# Patient Record
Sex: Female | Born: 1980 | Race: Black or African American | Hispanic: No | Marital: Married | Smoking: Never smoker
Health system: Southern US, Community
[De-identification: ages and names within clinical notes are randomized; demographics above are authoritative.]

## PROBLEM LIST (undated history)

## (undated) DIAGNOSIS — E663 Overweight: Secondary | ICD-10-CM

## (undated) DIAGNOSIS — R87629 Unspecified abnormal cytological findings in specimens from vagina: Secondary | ICD-10-CM

## (undated) DIAGNOSIS — K760 Fatty (change of) liver, not elsewhere classified: Secondary | ICD-10-CM

## (undated) DIAGNOSIS — B351 Tinea unguium: Secondary | ICD-10-CM

## (undated) DIAGNOSIS — Z8759 Personal history of other complications of pregnancy, childbirth and the puerperium: Secondary | ICD-10-CM

## (undated) DIAGNOSIS — K8020 Calculus of gallbladder without cholecystitis without obstruction: Secondary | ICD-10-CM

## (undated) DIAGNOSIS — D649 Anemia, unspecified: Secondary | ICD-10-CM

## (undated) HISTORY — DX: Tinea unguium: B35.1

## (undated) HISTORY — DX: Unspecified abnormal cytological findings in specimens from vagina: R87.629

## (undated) HISTORY — DX: Anemia, unspecified: D64.9

## (undated) HISTORY — DX: Overweight: E66.3

## (undated) HISTORY — DX: Fatty (change of) liver, not elsewhere classified: K76.0

## (undated) HISTORY — DX: Personal history of other complications of pregnancy, childbirth and the puerperium: Z87.59

## (undated) HISTORY — DX: Calculus of gallbladder without cholecystitis without obstruction: K80.20

---

## 2001-05-19 HISTORY — PX: GYNECOLOGIC CRYOSURGERY: SHX857

## 2002-11-15 ENCOUNTER — Encounter: Admission: RE | Admit: 2002-11-15 | Discharge: 2002-11-15 | Payer: Self-pay | Admitting: Obstetrics and Gynecology

## 2003-03-14 ENCOUNTER — Encounter: Admission: RE | Admit: 2003-03-14 | Discharge: 2003-03-14 | Payer: Self-pay | Admitting: Obstetrics and Gynecology

## 2006-06-15 ENCOUNTER — Ambulatory Visit: Payer: Self-pay | Admitting: Family Medicine

## 2006-06-15 LAB — CONVERTED CEMR LAB
Glucose, Bld: 78 mg/dL (ref 70–99)
HDL: 52.9 mg/dL (ref >39.0)
MCHC: 34.3 g/dL (ref 30.0–36.0)
MCV: 80.2 fL (ref 78.0–100.0)
RBC: 4.21 M/uL (ref 3.87–5.11)
RDW: 13.8 % (ref 11.5–14.6)
Total CHOL/HDL Ratio: 2.2
Triglycerides: 58 mg/dL (ref 0–149)
VLDL: 12 mg/dL (ref 0–40)

## 2006-07-20 ENCOUNTER — Ambulatory Visit: Payer: Self-pay | Admitting: Family Medicine

## 2006-07-20 LAB — CONVERTED CEMR LAB
Basophils Absolute: 0 10*3/uL (ref 0.0–0.1)
Eosinophils Absolute: 0.2 10*3/uL (ref 0.0–0.6)
MCHC: 33.2 g/dL (ref 30.0–36.0)
MCV: 80.5 fL (ref 78.0–100.0)
Platelets: 237 10*3/uL (ref 150–400)
RBC: 4.28 M/uL (ref 3.87–5.11)

## 2007-11-09 ENCOUNTER — Encounter (INDEPENDENT_AMBULATORY_CARE_PROVIDER_SITE_OTHER): Payer: Self-pay | Admitting: *Deleted

## 2007-11-09 ENCOUNTER — Other Ambulatory Visit: Admission: RE | Admit: 2007-11-09 | Discharge: 2007-11-09 | Payer: Self-pay | Admitting: Internal Medicine

## 2007-11-09 ENCOUNTER — Ambulatory Visit: Payer: Self-pay | Admitting: Internal Medicine

## 2007-11-09 ENCOUNTER — Encounter: Payer: Self-pay | Admitting: Internal Medicine

## 2007-11-09 DIAGNOSIS — R87619 Unspecified abnormal cytological findings in specimens from cervix uteri: Secondary | ICD-10-CM

## 2007-11-09 DIAGNOSIS — D509 Iron deficiency anemia, unspecified: Secondary | ICD-10-CM | POA: Insufficient documentation

## 2007-11-09 HISTORY — DX: Unspecified abnormal cytological findings in specimens from cervix uteri: R87.619

## 2007-11-09 LAB — CONVERTED CEMR LAB: Beta hcg, urine, semiquantitative: NEGATIVE

## 2007-11-15 LAB — CONVERTED CEMR LAB
Basophils Absolute: 0.1 10*3/uL (ref 0.0–0.1)
Calcium: 9.5 mg/dL (ref 8.4–10.5)
Cholesterol: 145 mg/dL (ref 0–200)
Eosinophils Absolute: 0.2 10*3/uL (ref 0.0–0.7)
GFR calc Af Amer: 109 mL/min
Glucose, Bld: 82 mg/dL (ref 70–99)
HCT: 38.6 % (ref 36.0–46.0)
Hemoglobin: 12.7 g/dL (ref 12.0–15.0)
MCHC: 33 g/dL (ref 30.0–36.0)
MCV: 81.2 fL (ref 78.0–100.0)
Monocytes Absolute: 0.2 10*3/uL (ref 0.1–1.0)
Monocytes Relative: 2.5 % — ABNORMAL LOW (ref 3.0–12.0)
Neutro Abs: 5 10*3/uL (ref 1.4–7.7)
RDW: 12.9 % (ref 11.5–14.6)
Sodium: 136 meq/L (ref 135–145)
TSH: 0.54 microintl units/mL (ref 0.35–5.50)
Triglycerides: 40 mg/dL (ref 0–149)

## 2007-11-17 ENCOUNTER — Encounter (INDEPENDENT_AMBULATORY_CARE_PROVIDER_SITE_OTHER): Payer: Self-pay | Admitting: *Deleted

## 2007-11-22 ENCOUNTER — Encounter (INDEPENDENT_AMBULATORY_CARE_PROVIDER_SITE_OTHER): Payer: Self-pay | Admitting: *Deleted

## 2008-10-19 ENCOUNTER — Telehealth (INDEPENDENT_AMBULATORY_CARE_PROVIDER_SITE_OTHER): Payer: Self-pay | Admitting: *Deleted

## 2008-11-21 ENCOUNTER — Encounter: Payer: Self-pay | Admitting: Internal Medicine

## 2008-11-21 ENCOUNTER — Ambulatory Visit: Payer: Self-pay | Admitting: Internal Medicine

## 2008-11-21 ENCOUNTER — Other Ambulatory Visit: Admission: RE | Admit: 2008-11-21 | Discharge: 2008-11-21 | Payer: Self-pay | Admitting: Internal Medicine

## 2008-12-01 ENCOUNTER — Telehealth (INDEPENDENT_AMBULATORY_CARE_PROVIDER_SITE_OTHER): Payer: Self-pay | Admitting: *Deleted

## 2008-12-01 ENCOUNTER — Encounter (INDEPENDENT_AMBULATORY_CARE_PROVIDER_SITE_OTHER): Payer: Self-pay | Admitting: *Deleted

## 2009-04-18 ENCOUNTER — Ambulatory Visit: Payer: Self-pay | Admitting: Family

## 2009-04-18 DIAGNOSIS — B351 Tinea unguium: Secondary | ICD-10-CM | POA: Insufficient documentation

## 2009-04-18 LAB — CONVERTED CEMR LAB
ALT: 22 units/L (ref 0–35)
AST: 17 units/L (ref 0–37)
BUN: 12 mg/dL (ref 6–23)
Bilirubin, Direct: 0 mg/dL (ref 0.0–0.3)
Chloride: 106 meq/L (ref 96–112)
LDL Goal: 160 mg/dL
Potassium: 4.2 meq/L (ref 3.5–5.1)
Total Bilirubin: 0.5 mg/dL (ref 0.3–1.2)

## 2009-04-19 ENCOUNTER — Encounter: Payer: Self-pay | Admitting: Family

## 2009-06-01 ENCOUNTER — Ambulatory Visit: Payer: Self-pay | Admitting: Family

## 2009-06-01 DIAGNOSIS — E663 Overweight: Secondary | ICD-10-CM | POA: Insufficient documentation

## 2009-06-01 LAB — CONVERTED CEMR LAB
ALT: 16 units/L (ref 0–35)
Albumin: 3.8 g/dL (ref 3.5–5.2)
Total Bilirubin: 0.5 mg/dL (ref 0.3–1.2)
Total Protein: 7 g/dL (ref 6.0–8.3)

## 2009-06-04 ENCOUNTER — Encounter: Payer: Self-pay | Admitting: Family

## 2009-06-15 ENCOUNTER — Ambulatory Visit: Payer: Self-pay | Admitting: Family

## 2009-06-18 LAB — CONVERTED CEMR LAB
Bilirubin, Direct: 0.1 mg/dL (ref 0.0–0.3)
Total Bilirubin: 0.5 mg/dL (ref 0.3–1.2)

## 2009-07-23 ENCOUNTER — Ambulatory Visit: Payer: Self-pay | Admitting: Family

## 2009-07-23 LAB — CONVERTED CEMR LAB
ALT: 17 units/L (ref 0–35)
Albumin: 3.6 g/dL (ref 3.5–5.2)
Alkaline Phosphatase: 43 units/L (ref 39–117)
Basophils Relative: 1.5 % (ref 0.0–3.0)
Eosinophils Absolute: 0.1 10*3/uL (ref 0.0–0.7)
Folate: 5.1 ng/mL
Hemoglobin: 11.5 g/dL — ABNORMAL LOW (ref 12.0–15.0)
Iron: 101 ug/dL (ref 42–145)
MCHC: 33 g/dL (ref 30.0–36.0)
MCV: 84.1 fL (ref 78.0–100.0)
Monocytes Absolute: 0.2 10*3/uL (ref 0.1–1.0)
Neutro Abs: 4 10*3/uL (ref 1.4–7.7)
RBC: 4.14 M/uL (ref 3.87–5.11)
Total Protein: 7.1 g/dL (ref 6.0–8.3)

## 2009-07-24 ENCOUNTER — Encounter: Payer: Self-pay | Admitting: Family

## 2009-09-03 ENCOUNTER — Ambulatory Visit: Payer: Self-pay | Admitting: Family

## 2009-09-03 DIAGNOSIS — H02849 Edema of unspecified eye, unspecified eyelid: Secondary | ICD-10-CM | POA: Insufficient documentation

## 2009-11-26 ENCOUNTER — Other Ambulatory Visit: Admission: RE | Admit: 2009-11-26 | Discharge: 2009-11-26 | Payer: Self-pay | Admitting: Internal Medicine

## 2009-11-26 ENCOUNTER — Ambulatory Visit: Payer: Self-pay | Admitting: Family

## 2009-11-26 LAB — CONVERTED CEMR LAB
Alkaline Phosphatase: 40 units/L (ref 39–117)
BUN: 14 mg/dL (ref 6–23)
CO2: 24 meq/L (ref 19–32)
Cholesterol: 153 mg/dL (ref 0–200)
Creatinine, Ser: 0.84 mg/dL (ref 0.40–1.20)
Eosinophils Absolute: 0.1 10*3/uL (ref 0.0–0.7)
Eosinophils Relative: 1 % (ref 0–5)
Glucose, Bld: 88 mg/dL (ref 70–99)
HCT: 38.1 % (ref 36.0–46.0)
HDL: 67 mg/dL (ref >39)
Hemoglobin: 11.9 g/dL — ABNORMAL LOW (ref 12.0–15.0)
LDL Cholesterol: 67 mg/dL (ref 0–99)
Lymphs Abs: 2.8 10*3/uL (ref 0.7–4.0)
MCV: 82.3 fL (ref 78.0–100.0)
Monocytes Absolute: 0.5 10*3/uL (ref 0.1–1.0)
Platelets: 255 10*3/uL (ref 150–400)
Sodium: 137 meq/L (ref 135–145)
Total Bilirubin: 0.5 mg/dL (ref 0.3–1.2)
Total CHOL/HDL Ratio: 2.3
Total Protein: 7.3 g/dL (ref 6.0–8.3)
Triglycerides: 96 mg/dL (ref <150)
VLDL: 19 mg/dL (ref 0–40)
WBC: 9.2 10*3/uL (ref 4.0–10.5)

## 2009-11-26 LAB — HM PAP SMEAR

## 2009-11-27 ENCOUNTER — Telehealth: Payer: Self-pay | Admitting: Family

## 2009-11-28 ENCOUNTER — Encounter: Payer: Self-pay | Admitting: Family

## 2009-12-05 ENCOUNTER — Telehealth: Payer: Self-pay | Admitting: Family

## 2010-06-18 NOTE — Assessment & Plan Note (Signed)
Summary: 6 week fu/kdc rsc with pt/mhf rsc to 11:30 pt got lost/mhf   Vital Signs:  Patient profile:   30 year old female Height:      65.5 inches Weight:      170.75 pounds BMI:     28.08 Temp:     98.4 degrees F oral Pulse rate:   72 / minute Pulse rhythm:   regular Resp:     12 per minute BP sitting:   114 / 60  (right arm) Cuff size:   regular  Vitals Entered By: Mervin Kung CMA (September 03, 2009 11:27 AM) CC: room 5  6 week follow up.   Is Patient Diabetic? No   CC:  room 5  6 week follow up.  Marland Kitchen  History of Present Illness: Patient presents today for follow up of her onychomycosis. She reports improvement in the hyperpigmentation of her toenails with the use of Terbinafine.  She also presents with swelling of her left eyelid.  Notes some difficulty keeping her left eye open due to swelling of her left lid.  Denies sneezing, denies drainage from her eye.  Tells me that she has seen her eye Dr. in the past and was told that this was due to allergies.  She has had similar eyelid swelling in the past- but notes that this has only happened in the Spring.    Allergies (verified): No Known Drug Allergies  Physical Exam  General:  Well-developed,well-nourished,in no acute distress; alert,appropriate and cooperative throughout examination Eyes:  Left eyelid is edematous, non-tender without erythema. PERRLA,  EOM's intact  Extremities:  bilateral 5th toenails with slight hyperpigmentation,  remainder of toenails normal.   Impression & Recommendations:  Problem # 1:  ONYCHOMYCOSIS, TOENAILS (ICD-110.1) Assessment Improved Symptoms improved.  Will plan to stop terbinafine.  If symptoms recur, plan referral to podiatry.    The following medications were removed from the medication list:    Terbinafine Hcl 250 Mg Tabs (Terbinafine hcl) ..... One tablet by mouth daily  Problem # 2:  EDEMA, LEFT EYELID (ICD-374.82) Assessment: New I suspect that this is due to allergies, but  will treat empirically with ciloxan drops and will add zyrtec once daily for her allergies.    Complete Medication List: 1)  Ortho Tri-cyclen (28) 0.18/0.215/0.25 Mg-35 Mcg Tabs (Norgestim-eth estrad triphasic) .... As directed 2)  Daily Multi Tabs (Multiple vitamins-minerals) .... Take 1 tablet by mouth once a day 3)  Zyrtec Allergy 10 Mg Caps (Cetirizine hcl) .... One tab by mouth daily 4)  Ciloxan 0.3 % Soln (Ciprofloxacin hcl) .... Two drops twice daily in left eye every 2 hours while awake x 2 days, then every 4 hours x 5 days  Patient Instructions: 1)  Please follow up in 1 week. 2)  Call if you develop worsening swelling, redness, discharge or discomfort of left eye.   3)  Apply cool compresses several times daily to your left eye. 4)  Go to ER if you develop difficulty seeing out of your left eye. Prescriptions: CILOXAN 0.3 % SOLN (CIPROFLOXACIN HCL) two drops twice daily in left eye every 2 hours while awake x 2 days, then every 4 hours x 5 days  #1 x 0   Entered and Authorized by:   Lemont Fillers FNP   Signed by:   Lemont Fillers FNP on 09/03/2009   Method used:   Electronically to        [facility] [phone]* (retail)  [address]       [place], [place]  [address]       Ph: [phone]       Fax: [phone]   RxID:   [id number]   Current Allergies (reviewed today): No known allergies

## 2010-06-18 NOTE — Progress Notes (Signed)
Summary: lab results  Phone Note Outgoing Call   Summary of Call: Please call patient and let her know that her pregnancy test is negative.  Very slightly anemic.  She should continue MVI with minerals.  Otherwise labs look good. Initial call taken by: Lemont Fillers FNP,  November 27, 2009 8:15 AM  Follow-up for Phone Call        Left message on machine to return my call.  Nicki Guadalajara Fergerson CMA Duncan Dull)  November 27, 2009 8:42 AM   Additional Follow-up for Phone Call Additional follow up Details #1::        patient advised per Sandford Craze instructions Additional Follow-up by: Glendell Docker CMA,  November 27, 2009 9:00 AM

## 2010-06-18 NOTE — Letter (Signed)
   [place] [facility] [address] [place], [place]  [address]  [place] Phone: [phone]      November 28, 2009   [facility] [address] [place], [place] [address]  RE:  LAB RESULTS  Dear  Ms. Cruzan,  The following is an interpretation of your most recent lab tests.  Please take note of any instructions provided or changes to medications that have resulted from your lab work.  Pap Smear: normal  ELECTROLYTES:  Good - no changes needed  KIDNEY FUNCTION TESTS:  Good - no changes needed  LIVER FUNCTION TESTS:  Good - no changes needed  LIPID PANEL:  Good - no changes needed Triglyceride: 96   Cholesterol: 153   LDL: 67   HDL: 67   Chol/HDL%:  2.3 Ratio  THYROID STUDIES:  Thyroid studies normal TSH: 0.870     CBC:  Fair - review at your next visit Continue multivitamin with iron due to mild anemia.   Sincerely Yours,    Lemont Fillers FNP

## 2010-06-18 NOTE — Assessment & Plan Note (Signed)
Summary: cpx patient fasting/mhf   Vital Signs:  Patient profile:   30 year old female Height:      65.5 inches Weight:      166.50 pounds BMI:     27.38 O2 Sat:      100 % on Room air Temp:     98.3 degrees F oral Pulse rate:   76 / minute Pulse rhythm:   regular Resp:     12 per minute BP sitting:   110 / 80  (right arm) Cuff size:   large  Vitals Entered By: Glendell Docker CMA (November 26, 2009 9:43 AM)  O2 Flow:  Room air CC: Rm 5 - CPX Is Patient Diabetic? No Pain Assessment Patient in pain? no        CC:  Rm 5 - CPX.  History of Present Illness: Patient is here for a complete physical.   LMP was 2 weeks ago- normal.  + exercise (cardio/weights).  Last pap was 11/21/08- normal.  Diet is "ok" eats healthy.    Preventive Screening-Counseling & Management  Alcohol-Tobacco     Alcohol drinks/day: 0     Smoking Status: never  Caffeine-Diet-Exercise     Caffeine use/day: None     Does Patient Exercise: yes     Times/week: 3  Allergies (verified): No Known Drug Allergies  Past History:  Past Surgical History: Caesarean section Cryosurgery for abnormal Pap- 2003   Social History: Married twins 2006 (2 girls) Occupation: Engineer, civil (consulting) @ [employer] Never Smoked Alcohol use-yes (socially) rare Drug use-no Regular exercise-yes caffeine use - noCaffeine use/day:  None Does Patient Exercise:  yes  Review of Systems       Constitutional: Denies Fever ENT:  Denies nasal congestion or sore throat. Resp: Denies cough CV:  Denies Chest Pain GI:  mild nausea (x 2-3 weeks) no vomitting GU: Denies dysuria Lymphatic: Denies lymphadenopathy Musculoskeletal:  Denies muscle/joint pain Skin:  Denies Rashes,    Psychiatric: Denies depression or anxiety Neuro: Denies numbness or weakness.     Physical Exam  General:  Shy, soft spoken AA female, awake, alert and in NAD Head:  Normocephalic and atraumatic without obvious abnormalities. No apparent alopecia or  balding. Eyes:  PERRLA Ears:  External ear exam shows no significant lesions or deformities.  Otoscopic examination reveals clear canals, tympanic membranes are intact bilaterally without bulging, retraction, inflammation or discharge. Hearing is grossly normal bilaterally. Mouth:  Oral mucosa and oropharynx without lesions or exudates.  Teeth in good repair. Neck:  No deformities, masses, or tenderness noted. Breasts:  No mass, nodules, thickening, tenderness, bulging, retraction, inflamation, nipple discharge or skin changes noted.   Lungs:  Normal respiratory effort, chest expands symmetrically. Lungs are clear to auscultation, no crackles or wheezes. Heart:  Normal rate and regular rhythm. S1 and S2 normal without gallop, murmur, click, rub or other extra sounds. Abdomen:  Bowel sounds positive,abdomen soft and non-tender without masses, organomegaly or hernias noted. Genitalia:  Pelvic Exam:        External: normal female genitalia without lesions or masses        Vagina: normal without lesions or masses        Cervix: normal without lesions or masses        Adnexa: normal bimanual exam without masses or fullness        Uterus: normal by palpation        Pap smear: performed Msk:  No deformity or scoliosis noted of thoracic or lumbar spine.  Extremities:  No clubbing, cyanosis, edema, or deformity noted with normal full range of motion of all joints.   Neurologic:  No cranial nerve deficits noted. Station and gait are normal. Plantar reflexes are down-going bilaterally. DTRs are symmetrical throughout. Sensory, motor and coordinative functions appear intact. Skin:  Intact without suspicious lesions or rashes Cervical Nodes:  No lymphadenopathy noted Axillary Nodes:  No palpable lymphadenopathy Psych:  Cognition and judgment appear intact. Alert and cooperative with normal attention span and concentration. No apparent delusions, illusions, hallucinations   Impression &  Recommendations:  Problem # 1:  HEALTH SCREENING (ICD-V70.0) Assessment Comment Only Patient was counseled on diet and exercise.  Pap smear performed today, will order fasting labs.   Orders: T-Comprehensive Metabolic Panel [id number]) T-Lipid Profile [id number]) T-CBC w/Diff [id number]) T-TSH [id number])  Complete Medication List: 1)  Ortho Tri-cyclen (28) 0.18/0.215/0.25 Mg-35 Mcg Tabs (Norgestim-eth estrad triphasic) .... As directed 2)  Daily Multi Tabs (Multiple vitamins-minerals) .... Take 1 tablet by mouth once a day 3)  Zyrtec Allergy 10 Mg Caps (Cetirizine hcl) .... One tab by mouth daily  Other Orders: T-Hcg [id number])  Patient Instructions: 1)  Keep up the good work with diet and exercise. 2)  We will mail you the results of your Pap smear and lab work. 3)  Please follow up in 1 year, sooner if problems or concerns. 4)  Have a nice summer. Prescriptions: ORTHO TRI-CYCLEN (28) 0.18/0.215/0.25 MG-35 MCG TABS (NORGESTIM-ETH ESTRAD TRIPHASIC) as directed  #1 x 11   Entered and Authorized by:   Lemont Fillers FNP   Signed by:   Lemont Fillers FNP on 11/26/2009   Method used:   Electronically to        [facility] [phone]* (retail)       [address]       [place], [place]  [address]       Ph: [phone]       Fax: [phone]   RxID:   [id number]   Current Allergies (reviewed today): No known allergies

## 2010-06-18 NOTE — Letter (Signed)
   [facility] [address] [place], [place] [address] [phone]    June 04, 2009   [facility] [address] [place], [place] [address]  RE:  LAB RESULTS  Dear  [name],  The following is an interpretation of your most recent lab tests.  Please take note of any instructions provided or changes to medications that have resulted from your lab work.  LIVER FUNCTION TESTS:  Good - no changes needed     Sincerely Yours,    Lemont Fillers FNP

## 2010-06-18 NOTE — Letter (Signed)
   [facility] [address] [place], [place] [address] [phone]    July 24, 2009   Katisha Silsby [address] [place], [place] [address]  RE:  LAB RESULTS  Dear  Ms. Amburn,  The following is an interpretation of your most recent lab tests.  Please take note of any instructions provided or changes to medications that have resulted from your lab work.    CBC:  Fair - review at your next visit You are mildly anemic- please continue your multivitamin with minerals.   Sincerely Yours,    Lemont Fillers FNP

## 2010-06-18 NOTE — Assessment & Plan Note (Signed)
Summary: 6 WEEK FU/NS/KDC   Vital Signs:  Patient profile:   30 year old female Weight:      170.38 pounds BMI:     28.02 Temp:     97.2 degrees F oral Pulse rate:   72 / minute Pulse rhythm:   regular Resp:     16 per minute BP sitting:   122 / 86  (left arm) Cuff size:   regular  Vitals Entered By: Mervin Kung CMA (July 23, 2009 10:09 AM) CC: room 14  6 week f/u Comments Needs refill on Lamisil if treatment continues.   CC:  room 14  6 week f/u.  History of Present Illness: Ms Arseneault is a 30 year old female who presentes today for follow up of her onychomycosis.  Notes that she is starting to see some improvement in the discoloration of her toenails  Allergies (verified): No Known Drug Allergies  Physical Exam  General:  Well-developed,well-nourished,in no acute distress; alert,appropriate and cooperative throughout examination Extremities:  Bilateral 5th toenails are less hyperpigmented,  bilateral great toenails are slightly yellowed but improved.     Impression & Recommendations:  Problem # 1:  ONYCHOMYCOSIS, TOENAILS (ICD-110.1) Assessment Improved Pt is s/p 6 weeks of terbinafine.  Plan to check CBC and LFT's.  If stable, will continue x 6 weeks more.  If no resolution at 6 weeks, will refer to podiatry. Her updated medication list for this problem includes:    Terbinafine Hcl 250 Mg Tabs (Terbinafine hcl) ..... One tablet by mouth daily  Orders: Venipuncture ([address]) TLB-CBC Platelet - w/Differential (85025-CBCD) TLB-Hepatic/Liver Function Pnl (80076-HEPATIC)  Complete Medication List: 1)  Ortho Tri-cyclen (28) 0.18/0.215/0.25 Mg-35 Mcg Tabs (Norgestim-eth estrad triphasic) .... As directed 2)  Terbinafine Hcl 250 Mg Tabs (Terbinafine hcl) .... One tablet by mouth daily 3)  Daily Multi Tabs (Multiple vitamins-minerals) .... Take 1 tablet by mouth once a day  Patient Instructions: 1)  Please return in 6 weeks, sooner if questions or  concerns Prescriptions: TERBINAFINE HCL 250 MG TABS (TERBINAFINE HCL) one tablet by mouth daily  #45 x 0   Entered and Authorized by:   Lemont Fillers FNP   Signed by:   Lemont Fillers FNP on 07/23/2009   Method used:   Electronically to        [facility] [phone]* (retail)       [address]       [place], [place]  [address]       Ph: [phone]       Fax: [phone]   RxID:   [id number]   Current Allergies (reviewed today): No known allergies

## 2010-06-18 NOTE — Assessment & Plan Note (Signed)
Summary: 6 WEEKS OV//PH   Vital Signs:  Patient profile:   30 year old female Weight:      172.6 pounds Pulse rate:   60 / minute BP sitting:   104 / 72  (left arm)  Vitals Entered By: Doristine Devoid (June 01, 2009 8:29 AM) CC: 6 week ov   CC:  6 week ov.  History of Present Illness: Patient presents today for follow up.  Notes that she has not seen much improvement in her toenail discoloration since her last visit.  She has been taking fluconozole weekly as prescribed for the last 6 weeks.    Also has concern about her weight especially in abdominal area. Wants to know what she can do about this.    Allergies: No Known Drug Allergies  Review of Systems       +discoloration of toenails, concerned re: current weight, "I am the biggest I have ever been"  Physical Exam  General:  Well-developed,well-nourished,in no acute distress; alert,appropriate and cooperative throughout examination Extremities:  bilateral 5th toenails are dark/hyperpigmenetd.  Bilateral great toenails are yellowed.   Psych:  Cognition and judgment appear intact. Alert and cooperative with normal attention span and concentration. 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     Her updated medication list for this problem includes:    Terbinafine Hcl 250 Mg Tabs (Terbinafine hcl) ..... One tablet by mouth daily  Orders: Venipuncture ([address]) TLB-Hepatic/Liver Function Pnl (80076-HEPATIC)  Problem # 2:  OVERWEIGHT (ICD-278.02) Assessment: New Patient's weight is actually down 6 pounds since her last visit.  I encouraged healthy eating, diet and exercise.    Complete Medication List: 1)  Ortho Tri-cyclen (28) 0.18/0.215/0.25 Mg-35 Mcg  Tabs (Norgestim-eth estrad triphasic) .... As directed 2)  Terbinafine Hcl 250 Mg Tabs (Terbinafine hcl) .... One tablet by mouth daily  Patient Instructions: 1)  Continue to use condoms as a back up while taking this medication. 2)  Please return for follow up blood work in 2 weeks (LFT's 110.1) 3)  Follow up appointment in 6 weeks. Prescriptions: TERBINAFINE HCL 250 MG TABS (TERBINAFINE HCL) one tablet by mouth daily  #30 x 1   Entered and Authorized by:   Lemont Fillers FNP   Signed by:   Lemont Fillers FNP on 06/01/2009   Method used:   Electronically to        [facility] [phone]* (retail)       [address]       [place], [place]  [address]       Ph: [phone]       Fax: [phone]   RxID:   [id number]

## 2010-06-18 NOTE — Progress Notes (Signed)
Summary: services / charges  Phone Note Call from Patient Call back at [phone]   Caller: Patient Call For: Lemont Fillers FNP Summary of Call: Pt left voice message requesting papers with services and charges at her last physical. Pt wants to send info to supplemental ins.  Left message on home phone to return my call.  Spoke to pt and advised her to contact [employer] @ [phone].  Nicki Guadalajara Fergerson CMA Duncan Dull)  December 05, 2009 2:37 PM

## 2010-10-04 NOTE — H&P (Signed)
Nicole Bernard, Nicole Bernard              ACCOUNT NO.:  [id number]   MEDICAL RECORD NO.:  [id number]          PATIENT TYPE:  AMB   LOCATION:  SDC                           FACILITY:  [facility]   PHYSICIAN:  Duke Salvia. Marcelle Overlie, M.D.DATE OF BIRTH:  Apr 11, 1981   DATE OF ADMISSION:  DATE OF DISCHARGE:                                HISTORY & PHYSICAL   CHIEF COMPLAINT:  Missed AB.   HISTORY OF PRESENT ILLNESS:  A 30 year old, G2, P67, has a set of twins that  delivered March 2006.  She presented for a new OB appointment to our office  this week and by ultrasound was found to have a large, flat, and empty sac  with an anembryonic pregnancy, presents now for D&E.  This procedure  including the risk of bleeding, infection, other complications that may  require open or additional surgery are all discussed with her which she  understands and accepts.  She did conceive while she was on [employer].   PAST MEDICAL HISTORY:   ALLERGIES:  LATEX allergy.   PRIOR SURGERIES:  1.  Cesarean section March 2006 for twins.  2.  History of GBS in the past.  3.  Last Pap smear April 2006, she has had cryo in 2003.   FAMILY HISTORY:  Negative.   PHYSICAL EXAMINATION:  VITAL SIGNS:  Temp 98.2, blood pressure 100/70.  HEENT:  Unremarkable.  NECK:  Supple without masses.  LUNGS:  Clear.  CARDIOVASCULAR:  Regular rate and rhythm without murmurs, rubs, or gallops.  BREASTS:  Without masses.  ABDOMEN:  Soft, flat, and nontender.  PELVIC EXAM:  Normal external genitalia.  Vaginal and cervix clear.  Uterus  was 7 week size, mid position, adnexa negative.  EXTREMITIES/NEUROLOGIC EXAM:  Unremarkable.   IMPRESSION:  Missed abortion.   PLAN:  D&E procedure and risks reviewed as above.      Richard M. Marcelle Overlie, M.D.  Electronically Signed     RMH/MEDQ  D:  06/13/2005  T:  06/13/2005  Job:  [id number]

## 2010-10-04 NOTE — Group Therapy Note (Signed)
   NAME:  Nicole Bernard, ALBAUGH NO.:  [id number]   MEDICAL RECORD NO.:  [id number]                   PATIENT TYPE:  OUT   LOCATION:  [facility]                           FACILITY:  [facility]   PHYSICIAN:  Argentina Donovan, M.D.                   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  The examination of the cervix  revealed a very small nulliparous cervix with a very minimal lesion, and  because of the patient's age and being a nulligravida and a nonsmoker we  decided to treat her with cryosurgery rather than with a LEEP.  Using the  nitrous oxide cryo unit a three minute freeze with a five minute thaw and a  three minute freeze was carried out without incident.  The patient tolerated  the procedure well and will return in four months for repeat Pap smear.                                               Argentina Donovan, M.D.    PR/MEDQ  D:  11/15/2002  T:  11/15/2002  Job:  [id number]

## 2010-11-08 ENCOUNTER — Other Ambulatory Visit: Payer: Self-pay | Admitting: Family

## 2010-11-08 NOTE — Telephone Encounter (Signed)
30 day supply sent to pharmacy. Pt was notified.

## 2010-11-08 NOTE — Telephone Encounter (Signed)
REFILL ON BIRTH CONTROL  SHE IS SCHEDULED FOR HER CPE ON [address]

## 2010-11-08 NOTE — Telephone Encounter (Signed)
Pt has called back stating that she is leaving tomorrow for vacation and is hoping rx will be called in today.

## 2010-11-19 ENCOUNTER — Encounter: Payer: Self-pay | Admitting: Family

## 2010-11-25 ENCOUNTER — Encounter: Payer: Self-pay | Admitting: Family

## 2010-11-27 ENCOUNTER — Encounter: Payer: Self-pay | Admitting: Family

## 2010-11-27 ENCOUNTER — Other Ambulatory Visit (HOSPITAL_COMMUNITY)
Admission: RE | Admit: 2010-11-27 | Discharge: 2010-11-27 | Disposition: A | Discharge status: Home / Self Care | Payer: PRIVATE HEALTH INSURANCE | Source: Ambulatory Visit | Attending: Family | Admitting: Family

## 2010-11-27 ENCOUNTER — Ambulatory Visit (INDEPENDENT_AMBULATORY_CARE_PROVIDER_SITE_OTHER): Payer: PRIVATE HEALTH INSURANCE | Admitting: Family

## 2010-11-27 DIAGNOSIS — Z0000 Encounter for general adult medical examination without abnormal findings: Secondary | ICD-10-CM | POA: Insufficient documentation

## 2010-11-27 DIAGNOSIS — Z01419 Encounter for gynecological examination (general) (routine) without abnormal findings: Secondary | ICD-10-CM | POA: Insufficient documentation

## 2010-11-27 NOTE — Progress Notes (Signed)
Subjective:    Patient ID: Nicole Bernard, female    DOB: Jun 23, 1980, 30 y.o.   MRN: [id number]  HPI  Preventative- Exercise- "on and off",  Diet is good.  Has gained 17 pounds since last year.  She attributes her weight gain to being less active. She is due for Pap smear. She reports remote history of abnormal Pap smear with cryotherapy.last tetanus shot was in 2008.  Notes that she is "hot a lot" Periods are regular.  Denies heavy menstrual bleeding.      Review of Systems  Constitutional: Positive for unexpected weight change. Negative for fever.  HENT: Negative for hearing loss.   Eyes: Negative for visual disturbance.  Respiratory: Negative for chest tightness.   Cardiovascular: Negative for chest pain and leg swelling.  Gastrointestinal: Negative for nausea, vomiting and diarrhea.  Genitourinary: Negative for dysuria, frequency, hematuria and menstrual problem.  Musculoskeletal: Negative for myalgias and arthralgias.  Skin: Negative for rash.  Neurological: Negative for headaches.  Hematological: Negative for adenopathy.  Psychiatric/Behavioral:       Denies depression or anxiety.   Past Medical History  Diagnosis Date  . Anemia     iron deficient  . Onychomycosis of toenail   . Overweight   . Abnormal Pap smear of vagina   . History of miscarriage     History   Social History  . Marital Status: Married    Spouse Name: N/A    Number of Children: 2  . Years of Education: N/A   Occupational History  . nurse     [employer]   Social History Main Topics  . Smoking status: Never Smoker   . Smokeless tobacco: Never Used  . Alcohol Use: Yes     social--rarely  . Drug Use: No  . Sexually Active: Not on file   Other Topics Concern  . Not on file   Social History Narrative   Regular exercise:  Off and on.Caffeine Use: no    Past Surgical History  Procedure Date  . Cesarean section   . Gynecologic cryosurgery 2003    Family History  Problem Relation  Age of Onset  . Heart disease Neg Hx   . Hypertension Neg Hx   . Diabetes Neg Hx   . Cancer Neg Hx   . Stroke Neg Hx     No Known Allergies  Current Outpatient Prescriptions on File Prior to Visit  Medication Sig Dispense Refill  . cetirizine (ZYRTEC) 10 MG tablet Take 10 mg by mouth daily as needed.       . Multiple Vitamin (MULTIVITAMIN) tablet Take 1 tablet by mouth daily.        . TRINESSA, 28, 0.18/0.215/0.25 MG-35 MCG TABS TAKE AS DIRECTED  28 tablet  0    BP 118/80  Pulse 66  Temp(Src) 98.1 F (36.7 C) (Oral)  Resp 18  Wt 183 lb 1.9 oz (83.063 kg)  LMP 11/08/2010       Objective:   Physical Exam  Constitutional: She appears well-developed and well-nourished.  HENT:  Head: Normocephalic and atraumatic.  Eyes: Conjunctivae are normal. Pupils are equal, round, and reactive to light.  Neck: Normal range of motion. Neck supple. No JVD present. No tracheal deviation present.  Cardiovascular: Normal rate and regular rhythm.   Pulmonary/Chest: Effort normal and breath sounds normal. No respiratory distress. She has no wheezes. She has no rales. She exhibits no tenderness.  Abdominal: Soft. Bowel sounds are normal. She exhibits no distension and no mass.  There is no tenderness. There is no rebound and no guarding.  Genitourinary:       Pelvic exam is within normal limits. No adnexal fullness. Cervix is intact. No lesions.Bilateral breast exam is normal without palpable masses or axillary lymphadenopathy.  Lymphadenopathy:    She has no cervical adenopathy.  Skin: Skin is warm and dry. No rash noted. No erythema. No pallor.       Note toenail thickening is noted today. Toenails are painted.  Psychiatric: She has a normal mood and affect. Her behavior is normal. Judgment and thought content normal.          Assessment & Plan:

## 2010-11-27 NOTE — Patient Instructions (Signed)
Please complete your lab work on the first floor. Follow up in 1 year- sooner if problems or concerns.

## 2010-11-27 NOTE — Assessment & Plan Note (Signed)
The patient was counseled on diet, exercise, and weight loss. Immunizations reviewed and up-to-date. Pap smear performed today. Patient will complete fasting blood work. I will include a TSH given her recent weight gain and complain of heat intolerance.

## 2010-11-27 NOTE — Progress Notes (Signed)
Addended by: Mervin Kung A on: 11/27/2010 01:22 PM   Modules accepted: Orders

## 2010-11-28 LAB — BASIC METABOLIC PANEL WITH GFR
BUN: 13 mg/dL (ref 6–23)
CO2: 22 mEq/L (ref 19–32)
Calcium: 9.4 mg/dL (ref 8.4–10.5)
Creat: 0.72 mg/dL (ref 0.50–1.10)

## 2010-11-28 LAB — CBC WITH DIFFERENTIAL/PLATELET
Basophils Absolute: 0 10*3/uL (ref 0.0–0.1)
Basophils Relative: 0 % (ref 0–1)
Eosinophils Absolute: 0.1 10*3/uL (ref 0.0–0.7)
Eosinophils Relative: 2 % (ref 0–5)
Lymphocytes Relative: 34 % (ref 12–46)
MCH: 26.2 pg (ref 26.0–34.0)
MCHC: 31.9 g/dL (ref 30.0–36.0)
MCV: 82.1 fL (ref 78.0–100.0)
Platelets: 259 10*3/uL (ref 150–400)
RDW: 15.3 % (ref 11.5–15.5)
WBC: 4.8 10*3/uL (ref 4.0–10.5)

## 2010-11-28 LAB — TSH: TSH: 0.988 u[IU]/mL (ref 0.350–4.500)

## 2010-11-28 LAB — HEPATIC FUNCTION PANEL
ALT: 22 U/L (ref 0–35)
Albumin: 4.2 g/dL (ref 3.5–5.2)
Total Protein: 7.5 g/dL (ref 6.0–8.3)

## 2010-11-28 LAB — LIPID PANEL
Cholesterol: 148 mg/dL (ref 0–200)
Triglycerides: 67 mg/dL (ref <150)

## 2010-12-03 ENCOUNTER — Telehealth: Payer: Self-pay | Admitting: Family

## 2010-12-03 MED ORDER — NORGESTIM-ETH ESTRAD TRIPHASIC 0.18/0.215/0.25 MG-35 MCG PO TABS: 1.0000 | ORAL_TABLET | Freq: Every day | ORAL | Status: DC

## 2010-12-03 NOTE — Telephone Encounter (Signed)
Refill sent to pharmacy.   

## 2010-12-03 NOTE — Telephone Encounter (Signed)
Refill- tri-nessa tablets 28'S. Take as directed. Qty 28. Last fill 6.22.12

## 2010-12-04 ENCOUNTER — Encounter: Payer: Self-pay | Admitting: Family

## 2011-11-28 ENCOUNTER — Encounter: Payer: PRIVATE HEALTH INSURANCE | Admitting: Family

## 2011-12-08 ENCOUNTER — Ambulatory Visit (INDEPENDENT_AMBULATORY_CARE_PROVIDER_SITE_OTHER): Payer: PRIVATE HEALTH INSURANCE | Admitting: Family

## 2011-12-08 ENCOUNTER — Encounter: Payer: Self-pay | Admitting: Family

## 2011-12-08 ENCOUNTER — Other Ambulatory Visit (HOSPITAL_COMMUNITY)
Admission: RE | Admit: 2011-12-08 | Discharge: 2011-12-08 | Disposition: A | Discharge status: Home / Self Care | Payer: PRIVATE HEALTH INSURANCE | Source: Ambulatory Visit | Attending: Family | Admitting: Family

## 2011-12-08 VITALS — BP 100/70 | HR 60 | Temp 98.1°F | Resp 16 | Ht 65.5 in | Wt 193.1 lb

## 2011-12-08 DIAGNOSIS — Z01419 Encounter for gynecological examination (general) (routine) without abnormal findings: Secondary | ICD-10-CM | POA: Insufficient documentation

## 2011-12-08 DIAGNOSIS — Z0000 Encounter for general adult medical examination without abnormal findings: Secondary | ICD-10-CM

## 2011-12-08 LAB — LIPID PANEL
HDL: 57 mg/dL (ref >39)
LDL Cholesterol: 77 mg/dL (ref 0–99)
Total CHOL/HDL Ratio: 2.6 Ratio

## 2011-12-08 LAB — BASIC METABOLIC PANEL WITH GFR
BUN: 13 mg/dL (ref 6–23)
Creat: 0.8 mg/dL (ref 0.50–1.10)
GFR, Est African American: >89 mL/min
Glucose, Bld: 87 mg/dL (ref 70–99)
Potassium: 4.2 mEq/L (ref 3.5–5.3)

## 2011-12-08 LAB — CBC WITH DIFFERENTIAL/PLATELET
Basophils Absolute: 0 10*3/uL (ref 0.0–0.1)
HCT: 37.3 % (ref 36.0–46.0)
Lymphocytes Relative: 32 % (ref 12–46)
Neutro Abs: 3.6 10*3/uL (ref 1.7–7.7)
Platelets: 235 10*3/uL (ref 150–400)
RDW: 14.8 % (ref 11.5–15.5)
WBC: 6.3 10*3/uL (ref 4.0–10.5)

## 2011-12-08 LAB — HEPATIC FUNCTION PANEL
Bilirubin, Direct: 0.1 mg/dL (ref 0.0–0.3)
Total Bilirubin: 0.3 mg/dL (ref 0.3–1.2)

## 2011-12-08 NOTE — Progress Notes (Signed)
Subjective:    Patient ID: Nicole Bernard, female    DOB: 02/23/1981, 31 y.o.   MRN: [id number]  HPI  [name] is a 30 yr old female who is here today for her annual exam.   She has gained 10 pounds since her last visit.  She is due for pap smear. Tetanus is up to date.  Not exercising.     Review of Systems  Constitutional: Positive for unexpected weight change.  HENT: Negative for hearing loss.   Eyes: Negative for visual disturbance.  Cardiovascular: Negative for chest pain and palpitations.  Gastrointestinal: Negative for nausea, vomiting, diarrhea and constipation.  Genitourinary: Negative for menstrual problem.  Musculoskeletal: Negative for arthralgias.  Skin: Negative for rash.  Neurological: Negative for headaches.  Hematological: Negative for adenopathy.  Psychiatric/Behavioral:       Denies depression/anxiety   Past Medical History  Diagnosis Date  . Anemia     iron deficient  . Onychomycosis of toenail   . Overweight   . Abnormal Pap smear of vagina   . History of miscarriage     History   Social History  . Marital Status: Married    Spouse Name: N/A    Number of Children: 2  . Years of Education: N/A   Occupational History  . nurse     [employer]   Social History Main Topics  . Smoking status: Never Smoker   . Smokeless tobacco: Never Used  . Alcohol Use: Yes     social--rarely  . Drug Use: No  . Sexually Active: Not on file   Other Topics Concern  . Not on file   Social History Narrative   Regular exercise: noCaffeine Use: no    Past Surgical History  Procedure Date  . Cesarean section   . Gynecologic cryosurgery 2003    Family History  Problem Relation Age of Onset  . Heart disease Neg Hx   . Hypertension Neg Hx   . Diabetes Neg Hx   . Cancer Neg Hx   . Stroke Neg Hx     No Known Allergies  Current Outpatient Prescriptions on File Prior to Visit  Medication Sig Dispense Refill  . cetirizine (ZYRTEC) 10 MG tablet Take  10 mg by mouth daily as needed.       . Multiple Vitamin (MULTIVITAMIN) tablet Take 1 tablet by mouth daily.        Lorita Officer Triphasic (TRINESSA, 28,) 0.18/0.215/0.25 MG-35 MCG TABS Take 1 tablet by mouth daily.  28 tablet  6    BP 100/70  Pulse 60  Temp 98.1 F (36.7 C) (Oral)  Resp 16  Ht 5' 5.5" (1.664 m)  Wt 193 lb 1.3 oz (87.581 kg)  BMI 31.64 kg/m2  SpO2 99%  LMP 11/06/2011       Objective:   Physical Exam  Physical Exam  Constitutional: She is oriented to person, place, and time. She appears well-developed and well-nourished. No distress.  HENT:  Head: Normocephalic and atraumatic.  Right Ear: Tympanic membrane and ear canal normal.  Left Ear: Tympanic membrane and ear canal normal.  Mouth/Throat: Oropharynx is clear and moist.  Eyes: Pupils are equal, round, and reactive to light. No scleral icterus.  Neck: Normal range of motion. No thyromegaly present.  Cardiovascular: Normal rate and regular rhythm.   No murmur heard. Pulmonary/Chest: Effort normal and breath sounds normal. No respiratory distress. He has no wheezes. She has no rales. She exhibits no tenderness.  Abdominal: Soft.  Bowel sounds are normal. He exhibits no distension and no mass. There is no tenderness. There is no rebound and no guarding.  Musculoskeletal: She exhibits no edema.  Lymphadenopathy:    She has no cervical adenopathy.  Neurological: She is alert and oriented to person, place, and time. She has normal reflexes. She exhibits normal muscle tone. Coordination normal.  Skin: Skin is warm and dry.  Psychiatric: She has a normal mood and affect. Her behavior is normal. Judgment and thought content normal.  Breasts: Examined lying Right: Without masses, retractions, discharge or axillary adenopathy.  Left: Without masses, retractions, discharge or axillary adenopathy.  Inguinal/mons: Normal without inguinal adenopathy  External genitalia: Normal  BUS/Urethra/Skene's glands:  Normal  Bladder: Normal  Vagina: Normal  Cervix: Normal  Uterus: normal in size, shape and contour. Midline and mobile (pap performed- chaperone present) Adnexa/parametria:  Rt: Without masses or tenderness.  Lt: Without masses or tenderness.  Anus and perineum: Normal           Assessment & Plan:         Assessment & Plan:

## 2011-12-08 NOTE — Assessment & Plan Note (Signed)
Pt counseled on diet/exercise/weight loss.  Immunizations reviewed and up to date. Pap performed today.

## 2011-12-08 NOTE — Addendum Note (Signed)
Addended by: Mervin Kung A on: 12/08/2011 11:42 AM   Modules accepted: Orders

## 2011-12-08 NOTE — Patient Instructions (Addendum)
Please complete your blood work prior to leaving today. Follow up in 1 year, sooner if problems/concerns.Marland Kitchen

## 2011-12-09 ENCOUNTER — Encounter: Payer: Self-pay | Admitting: Family

## 2011-12-09 LAB — URINALYSIS, ROUTINE W REFLEX MICROSCOPIC
Glucose, UA: NEGATIVE mg/dL
Leukocytes, UA: NEGATIVE
pH: 5.5 (ref 5.0–8.0)

## 2011-12-10 ENCOUNTER — Encounter: Payer: Self-pay | Admitting: Family

## 2012-04-07 ENCOUNTER — Telehealth: Payer: Self-pay | Admitting: Family

## 2012-04-07 NOTE — Telephone Encounter (Signed)
I can fill out fmla, but the time off for helping her grandmother needs to be for taking her to dr apts and direct care such as bathing etc.

## 2012-04-07 NOTE — Telephone Encounter (Signed)
Patient states that she has been taking care of her grandmother and now has put her in a nursing home. Her grandmother lives 2 1/2 hours away and patient is having to take time off of work to go and see to her grandmother in the nursing home. She would like to know if Efraim Kaufmann would fill out FMLA forms for her?

## 2012-04-08 NOTE — Telephone Encounter (Signed)
Pt called back with additional information reporting that her grandmother was hospitalized 2 weeks after being found in the home unresponsive. Determined to have UTI with altered mental status. Pt was released to skilled nursing facility for 21 days and will be going home next Wednesday. Pt has taken a leave of absence from work from 04/01/12 through 05/20/12. She will be staying with her grandmother and providing care for her during this time as she reports that her grandmother still has some mobility issues and is not allowed to drive. States pt was only going to be able receive limited home health (1 nurse visit weekly) [place]. Please advise.

## 2012-04-08 NOTE — Telephone Encounter (Signed)
Notified pt of instruction below. Pt states she is requesting a leave of absence from work. She reports that she is spending a couple of hours a day with her grandmother and has to drive about 2 1/2 hours to get to the facility. Pt states she is finding it difficult to work and take care of her family in this current situation. She states that she is the only living relative in this area.  Please advise if you have any further recommendation?

## 2012-04-09 NOTE — Telephone Encounter (Signed)
Actually, the FMLA paperwork will need to be filled out by her grandmother's physician since clinical info will need to be provided for her grandmother and her grandmother is not our patient.

## 2012-04-09 NOTE — Telephone Encounter (Signed)
Notified pt and she voices understanding. 

## 2012-04-22 ENCOUNTER — Ambulatory Visit (INDEPENDENT_AMBULATORY_CARE_PROVIDER_SITE_OTHER): Payer: PRIVATE HEALTH INSURANCE

## 2012-04-22 DIAGNOSIS — Z23 Encounter for immunization: Secondary | ICD-10-CM

## 2012-05-27 ENCOUNTER — Telehealth: Payer: Self-pay | Admitting: *Deleted

## 2012-05-27 NOTE — Telephone Encounter (Signed)
Received message from pt on 05/26/12 requesting immunization report be faxed to [facility], Attn: Sarajane Jews. Fax) [phone].  Report faxed 05/27/12 at 2:15pm. Message left re: completion on voicemail at [phone] at pt's request.

## 2012-12-08 ENCOUNTER — Encounter: Payer: PRIVATE HEALTH INSURANCE | Admitting: Family

## 2012-12-13 ENCOUNTER — Ambulatory Visit: Payer: PRIVATE HEALTH INSURANCE | Admitting: Family

## 2012-12-22 ENCOUNTER — Telehealth: Payer: Self-pay | Admitting: *Deleted

## 2012-12-22 ENCOUNTER — Encounter: Payer: PRIVATE HEALTH INSURANCE | Admitting: Family

## 2012-12-22 DIAGNOSIS — Z0000 Encounter for general adult medical examination without abnormal findings: Secondary | ICD-10-CM

## 2012-12-22 LAB — CBC WITH DIFFERENTIAL/PLATELET
Basophils Relative: 0 % (ref 0–1)
HCT: 34.1 % — ABNORMAL LOW (ref 36.0–46.0)
Hemoglobin: 11.1 g/dL — ABNORMAL LOW (ref 12.0–15.0)
Lymphs Abs: 1.6 10*3/uL (ref 0.7–4.0)
MCH: 25.4 pg — ABNORMAL LOW (ref 26.0–34.0)
MCHC: 32.6 g/dL (ref 30.0–36.0)
Monocytes Absolute: 0.3 10*3/uL (ref 0.1–1.0)
Monocytes Relative: 7 % (ref 3–12)
Neutro Abs: 2.6 10*3/uL (ref 1.7–7.7)

## 2012-12-22 LAB — HEPATIC FUNCTION PANEL
ALT: 13 U/L (ref 0–35)
AST: 16 U/L (ref 0–37)
Bilirubin, Direct: 0.1 mg/dL (ref 0.0–0.3)
Indirect Bilirubin: 0.4 mg/dL (ref 0.0–0.9)

## 2012-12-22 LAB — URINALYSIS, ROUTINE W REFLEX MICROSCOPIC
Glucose, UA: NEGATIVE mg/dL
Hgb urine dipstick: NEGATIVE
Ketones, ur: NEGATIVE mg/dL
pH: 7 (ref 5.0–8.0)

## 2012-12-22 LAB — LIPID PANEL
LDL Cholesterol: 48 mg/dL (ref 0–99)
Total CHOL/HDL Ratio: 1.9 Ratio
Triglycerides: 36 mg/dL (ref <150)
VLDL: 7 mg/dL (ref 0–40)

## 2012-12-22 LAB — BASIC METABOLIC PANEL
CO2: 25 mEq/L (ref 19–32)
Calcium: 9.1 mg/dL (ref 8.4–10.5)
Chloride: 107 mEq/L (ref 96–112)
Sodium: 138 mEq/L (ref 135–145)

## 2012-12-22 NOTE — Telephone Encounter (Signed)
Pt arrived for CPE 16 minutes late. Lab orders given for pt to complete this morning as she is fasting and physical will be rescheduled for another date. Orders entered per verbal from Provider for:  Cbc w/diff, bmp, lipids, lfts, tsh, u/a.

## 2013-01-03 ENCOUNTER — Other Ambulatory Visit (HOSPITAL_COMMUNITY)
Admission: RE | Admit: 2013-01-03 | Discharge: 2013-01-03 | Disposition: A | Discharge status: Home / Self Care | Payer: PRIVATE HEALTH INSURANCE | Source: Ambulatory Visit | Attending: Family | Admitting: Family

## 2013-01-03 ENCOUNTER — Encounter: Payer: Self-pay | Admitting: Family

## 2013-01-03 ENCOUNTER — Ambulatory Visit (INDEPENDENT_AMBULATORY_CARE_PROVIDER_SITE_OTHER): Payer: PRIVATE HEALTH INSURANCE | Admitting: Family

## 2013-01-03 VITALS — BP 102/80 | HR 66 | Temp 98.4°F | Resp 18 | Wt 181.0 lb

## 2013-01-03 DIAGNOSIS — K429 Umbilical hernia without obstruction or gangrene: Secondary | ICD-10-CM

## 2013-01-03 DIAGNOSIS — Z0000 Encounter for general adult medical examination without abnormal findings: Secondary | ICD-10-CM

## 2013-01-03 DIAGNOSIS — Z01419 Encounter for gynecological examination (general) (routine) without abnormal findings: Secondary | ICD-10-CM

## 2013-01-03 DIAGNOSIS — D509 Iron deficiency anemia, unspecified: Secondary | ICD-10-CM

## 2013-01-03 NOTE — Progress Notes (Signed)
Subjective:    Patient ID: Nicole Bernard, female    DOB: Jun 14, 1980, 32 y.o.   MRN: [id number]  HPI  Patient presents today for complete physical.  Immunizations: tetanus up to date Diet: reports healthy diet. Exercise: walking, working day shift instead of nights.  Pap Smear: reports remote abnormal pap smear in her 20's.  Birth control:  Husband had vasectomy  She does report some umbilical tenderness. Notes occasional bulging in this area.  Review of Systems  Constitutional: Negative for unexpected weight change.  HENT: Negative for hearing loss and congestion.   Eyes: Negative for visual disturbance.  Respiratory: Negative for cough and shortness of breath.   Cardiovascular: Negative for chest pain and leg swelling.  Gastrointestinal: Negative for vomiting, diarrhea and constipation.  Genitourinary: Negative for dysuria, frequency and menstrual problem.  Musculoskeletal: Negative for myalgias and arthralgias.  Skin: Negative for rash.  Neurological: Negative for headaches.  Hematological: Negative for adenopathy.  Psychiatric/Behavioral:       Denies depression or anxiety       Past Medical History  Diagnosis Date  . Anemia     iron deficient  . Onychomycosis of toenail   . Overweight(278.02)   . Abnormal Pap smear of vagina   . History of miscarriage     History   Social History  . Marital Status: Married    Spouse Name: N/A    Number of Children: 2  . Years of Education: N/A   Occupational History  . nurse     [employer]   Social History Main Topics  . Smoking status: Never Smoker   . Smokeless tobacco: Never Used  . Alcohol Use: Yes     Comment: social--rarely  . Drug Use: No  . Sexual Activity: Not on file   Other Topics Concern  . Not on file   Social History Narrative   Regular exercise: no   Caffeine Use: no   Lives with husband and 2 children   Works at [employer]   Enjoys shopping                   Past  Surgical History  Procedure Laterality Date  . Cesarean section    . Gynecologic cryosurgery  2003    Family History  Problem Relation Age of Onset  . Heart disease Neg Hx   . Hypertension Neg Hx   . Diabetes Neg Hx   . Cancer Neg Hx   . Stroke Neg Hx     Allergies  Allergen Reactions  . Latex Swelling    Swelling of lips and face.    Current Outpatient Prescriptions on File Prior to Visit  Medication Sig Dispense Refill  . cetirizine (ZYRTEC) 10 MG tablet Take 10 mg by mouth daily as needed.       . Multiple Vitamin (MULTIVITAMIN) tablet Take 1 tablet by mouth daily.         No current facility-administered medications on file prior to visit.    BP 102/80  Pulse 66  Temp(Src) 98.4 F (36.9 C) (Oral)  Resp 18  Wt 181 lb (82.101 kg)  BMI 29.65 kg/m2  SpO2 99%  LMP 12/30/2012    Objective:   Physical Exam  Physical Exam  Constitutional: She is oriented to person, place, and time. She appears well-developed and well-nourished. No distress.  HENT:  Head: Normocephalic and atraumatic.  Right Ear: Tympanic membrane and ear canal normal.  Left Ear: Tympanic membrane and ear canal  normal.  Mouth/Throat: Oropharynx is clear and moist.  Eyes: Pupils are equal, round, and reactive to light. No scleral icterus.  Neck: Normal range of motion. No thyromegaly present.  Cardiovascular: Normal rate and regular rhythm.   No murmur heard. Pulmonary/Chest: Effort normal and breath sounds normal. No respiratory distress. He has no wheezes. She has no rales. She exhibits no tenderness.  Abdominal: Soft. Bowel sounds are normal. she exhibits no distension. A small reducible umbilical hernia is noted. There is no tenderness. There is no rebound and no guarding.  Musculoskeletal: She exhibits no edema.  Lymphadenopathy:    She has no cervical adenopathy.  Neurological: She is alert and oriented to person, place, and time.  She exhibits normal muscle tone. Coordination normal.   Skin: Skin is warm and dry.  Psychiatric: She has a normal mood and affect. Her behavior is normal. Judgment and thought content normal.  Breasts: Examined lying Right: Without masses, retractions, discharge or axillary adenopathy.  Left: Without masses, retractions, discharge or axillary adenopathy.  Inguinal/mons: Normal without inguinal adenopathy  External genitalia: Normal  BUS/Urethra/Skene's glands: Normal  Bladder: Normal  Vagina: Normal  Cervix: Normal  Uterus: normal in size, shape.  Midline and mobile some small external fibroids are palpated on the lower uterus.  Adnexa/parametria:  Rt: Without masses or tenderness.  Lt: Without masses or tenderness.  Anus and perineum: Normal           Assessment & Plan:        Assessment & Plan:

## 2013-01-03 NOTE — Assessment & Plan Note (Signed)
Mild anemia noted- this is chronic. She reports heavy menstrual cycles. Recommended that she add a multivitamin with minerals.

## 2013-01-03 NOTE — Addendum Note (Signed)
Addended by: Mervin Kung A on: 01/03/2013 05:00 PM   Modules accepted: Orders

## 2013-01-03 NOTE — Assessment & Plan Note (Signed)
Continue healthy diet, exercise.  Immunizations up to date. Pap performed with chaperone. Discussed BSE.

## 2013-01-03 NOTE — Patient Instructions (Addendum)
You will be contacted about your referral to the surgeon. We will contact you with your pap smear results. Follow up in 1 year, sooner if problems/concerns.

## 2013-01-03 NOTE — Assessment & Plan Note (Signed)
Reducible. Will refer to surgeon for evaluation.  Discussed symptoms of incarcerated hernia and advised pt to go to ED if this occurs. She verbalizes understanding.

## 2013-01-05 ENCOUNTER — Encounter: Payer: Self-pay | Admitting: Family

## 2013-01-10 ENCOUNTER — Encounter (INDEPENDENT_AMBULATORY_CARE_PROVIDER_SITE_OTHER): Payer: Self-pay | Admitting: General Surgery

## 2013-01-10 ENCOUNTER — Ambulatory Visit (INDEPENDENT_AMBULATORY_CARE_PROVIDER_SITE_OTHER): Payer: PRIVATE HEALTH INSURANCE | Admitting: General Surgery

## 2013-01-10 VITALS — BP 110/70 | HR 76 | Resp 16 | Ht 65.5 in | Wt 180.6 lb

## 2013-01-10 DIAGNOSIS — K429 Umbilical hernia without obstruction or gangrene: Secondary | ICD-10-CM

## 2013-01-10 NOTE — Patient Instructions (Signed)
You have an umbilical hernia. This has been bulging out and has been somewhat tender lately.  This will need to be repaired at some point. You have stated that you would like to go ahead and have it repaired at this time.  You will be scheduled for repair of umbilical hernia with mesh in the near future.     Umbilical Herniorrhaphy Herniorrhaphy is surgery to repair a hernia. A hernia is the protrusion of a part of an organ through an abdominal opening. An umbilical hernia means that your hernia is in the area around your navel. If the hernia is not repaired, the gap could get bigger. Your intestines or other tissues, such as fat, could get trapped in the gap. This can lead to other health problems, such as blocked intestines. If the hernia is fixed before problems set in, you may be allowed to go home the same day as the surgery (outpatient). LET YOUR CAREGIVER KNOW ABOUT:  Allergies to food or medicine.  Medicines taken, including vitamins, herbs, eyedrops, over-the-counter medicines, and creams.  Use of steroids (by mouth or creams).  Previous problems with anesthetics or numbing medicines.  History of bleeding problems or blood clots.  Previous surgery.  Other health problems, including diabetes and kidney problems.  Possibility of pregnancy, if this applies. RISKS AND COMPLICATIONS  Pain.  Excessive bleeding.  Hematoma. This is a pocket of blood that collects under the surgery site.  Infection at the surgery site.  Numbness at the surgery site.  Swelling and bruising.  Blood clots.  Intestinal damage (rare).  Scarring.  Skin damage.  Development of another hernia. This may require another surgery. BEFORE THE PROCEDURE  Ask your caregiver about changing or stopping your regular medicines. You may need to stop taking aspirin, nonsteroidal anti-inflammatory drugs (NSAIDs), vitamin E, and blood thinners as early as 2 weeks before the procedure.  Do not eat or  drink for 8 hours before the procedure, or as directed by your caregiver.  You might be asked to shower or wash with an antibacterial soap before the procedure.  Wear comfortable clothes that will be easy to put on after the procedure. PROCEDURE You will be given an intravenous (IV) tube. A needle will be inserted in your arm. Medicine will flow directly into your body through this needle. You might be given medicine to help you relax (sedative). You will be given medicine that numbs the area (local anesthetic) or medicine that makes you sleep (general anesthetic). If you have open surgery:  The surgeon will make a cut (incision) in your abdomen.  The gap in the muscle wall will be repaired. The surgeon may sew the edges together over the gap or use a mesh material to strengthen the area. When mesh is used, the body grows new, strong tissue into and around it. This new tissue closes the gap.  A drain might be put in to remove excess fluid from the body after surgery.  The surgeon will close the incision with stitches, glue, or staples. If you have laparoscopic surgery:  The surgeon will make several small incisions in your abdomen.  A thin, lighted tube (laparoscope) will be inserted into the abdomen through an incision. A camera is attached to the laparoscope that allows the surgeon to see inside the abdomen.  Tools will be inserted through the other incisions to repair the hernia. Usually, mesh is used to cover the gap.  The surgeon will close the incisions with stitches. AFTER THE  PROCEDURE  You will be taken to a recovery area. A nurse will watch and check your progress.  When you are awake, feeling well, and taking fluids well, you may be allowed to go home. In some cases, you may need to stay overnight in the hospital.  Arrange for someone to drive you home. Document Released: 08/01/2008 Document Revised: 11/04/2011 Document Reviewed: 08/06/2011 [facility] Patient Information  2014 [place], [place].

## 2013-01-10 NOTE — Progress Notes (Addendum)
Patient ID: Nicole Bernard, female   DOB: 1981-03-29, 32 y.o.   MRN: [id number]  Chief Complaint  Patient presents with  . New Evaluation    eval umb hernia    HPI Nicole Bernard is a 32 y.o. female.  She is referred by Sandford Craze, NP., at [facility] for evaluation of symptomatic umbilical hernia.  The patient states that she has had a bulge and some pain at her umbilicus for 3-4 months. She has no prior history of any hernia. She's had a cesarean section for twins through a Pfannenstiel incision but no other abdominal surgery. She is is otherwise completely healthy.  She works at [employer]. 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 She does not this patient. Her husband is a Child psychotherapist.  HPI  Past Medical History  Diagnosis Date  . Anemia     iron deficient  . Onychomycosis of toenail   . Overweight(278.02)   . Abnormal Pap smear of vagina   . History of miscarriage     Past Surgical History  Procedure Laterality Date  . Cesarean section    . Gynecologic cryosurgery  2003    Family History  Problem Relation Age of Onset  . Heart disease Neg Hx   . Hypertension Neg Hx   . Diabetes Neg Hx   . Cancer Neg Hx   . Stroke Neg Hx     Social History History  Substance Use Topics  . Smoking status: Never Smoker   . Smokeless tobacco: Never Used  . Alcohol Use: Yes     Comment: social--rarely    Allergies  Allergen Reactions  . Latex Swelling    Swelling of lips and face.    Current Outpatient Prescriptions  Medication Sig Dispense Refill  . Multiple Vitamins-Minerals (MULTIVITAMIN WITH MINERALS) tablet Take 1 tablet by mouth daily.      . cetirizine (ZYRTEC) 10 MG tablet Take 10 mg by mouth daily as needed.        No current facility-administered medications for this visit.    Review of Systems Review of Systems  Constitutional: Negative for fever, chills and unexpected weight change.  HENT: Negative  for hearing loss, congestion, sore throat, trouble swallowing and voice change.   Eyes: Negative for visual disturbance.  Respiratory: Negative for cough and wheezing.   Cardiovascular: Negative for chest pain, palpitations and leg swelling.  Gastrointestinal: Positive for abdominal pain. Negative for nausea, vomiting, diarrhea, constipation, blood in stool, abdominal distention and anal bleeding.  Genitourinary: Negative for hematuria, vaginal bleeding and difficulty urinating.  Musculoskeletal: Negative for arthralgias.  Skin: Negative for rash and wound.  Neurological: Negative for seizures, syncope and headaches.  Hematological: Negative for adenopathy. Does not bruise/bleed easily.  Psychiatric/Behavioral: Negative for confusion.    Blood pressure 110/70, pulse 76, resp. rate 16, height 5' 5.5" (1.664 m), weight 180 lb 9.6 oz (81.92 kg), last menstrual period 12/30/2012.  Physical Exam Physical Exam  Constitutional: She is oriented to person, place, and time. She appears well-developed and well-nourished. No distress.  HENT:  Head: Normocephalic and atraumatic.  Nose: Nose normal.  Mouth/Throat: No oropharyngeal exudate.  Eyes: Conjunctivae and EOM are normal. Pupils are equal, round, and reactive to light. Left eye exhibits no discharge. No scleral icterus.  Neck: Neck supple. No JVD present. No tracheal deviation present. No thyromegaly present.  Cardiovascular: Normal rate, regular rhythm, normal heart sounds and intact distal pulses.   No murmur heard. Pulmonary/Chest:  Effort normal and breath sounds normal. No respiratory distress. She has no wheezes. She has no rales. She exhibits no tenderness.  Abdominal: Soft. Bowel sounds are normal. She exhibits no distension and no mass. There is no tenderness. There is no rebound and no guarding.  Small umbilical hernia. Hernia sac approximately 2.5 cm or less. Only partially reducible. Skin healthy. Slightly tender.Pfannenstiel  incision.  Genitourinary:  No inguinal hernias detectable when standing.  Musculoskeletal: She exhibits no edema and no tenderness.  Lymphadenopathy:    She has no cervical adenopathy.  Neurological: She is alert and oriented to person, place, and time. She exhibits normal muscle tone. Coordination normal.  Skin: Skin is warm. No rash noted. She is not diaphoretic. No erythema. No pallor.  Psychiatric: She has a normal mood and affect. Her behavior is normal. Judgment and thought content normal.    Data Reviewed Office notes from [place] [facility] [place]  Assessment    Incarcerated umbilical hernia. Symptomatic. Suspect incarcerated preperitoneal fat.  History of cesarean section through Pfannenstiel incision     Plan    She requests that we go ahead and schedule repair of her umbilical hernia. This is appropriate. I told her that we would do this through an infraumbilical transverse incision with inlay mesh.  I discussed the indications, details, techniques, and numerous risks of surgery with her. She is aware of the risk of recurrence, bleeding, infection, and rarely, intestinal injury. All of her questions are answered. She understands all these issues. She agrees with this plan.        Angelia Mould. Derrell Lolling, M.D., [facility] General and Minimally invasive Surgery Breast and Colorectal Surgery Office:   [phone] Pager:   [phone]  01/10/2013, 5:12 PM

## 2013-12-01 ENCOUNTER — Encounter: Payer: Self-pay | Admitting: Family

## 2013-12-20 ENCOUNTER — Encounter: Payer: Self-pay | Admitting: Family

## 2014-01-04 ENCOUNTER — Encounter: Payer: PRIVATE HEALTH INSURANCE | Admitting: Family

## 2014-01-10 ENCOUNTER — Encounter: Payer: PRIVATE HEALTH INSURANCE | Admitting: Family

## 2014-01-31 ENCOUNTER — Encounter: Payer: PRIVATE HEALTH INSURANCE | Admitting: Family

## 2014-04-17 ENCOUNTER — Encounter: Payer: PRIVATE HEALTH INSURANCE | Admitting: Family

## 2014-04-18 ENCOUNTER — Ambulatory Visit (INDEPENDENT_AMBULATORY_CARE_PROVIDER_SITE_OTHER): Payer: No Typology Code available for payment source | Admitting: *Deleted

## 2014-04-18 ENCOUNTER — Ambulatory Visit (INDEPENDENT_AMBULATORY_CARE_PROVIDER_SITE_OTHER): Payer: No Typology Code available for payment source | Admitting: Family

## 2014-04-18 ENCOUNTER — Telehealth: Payer: Self-pay | Admitting: Family

## 2014-04-18 ENCOUNTER — Encounter: Payer: Self-pay | Admitting: Family

## 2014-04-18 VITALS — BP 104/78 | HR 55 | Temp 98.1°F | Resp 16 | Ht 67.0 in | Wt 184.8 lb

## 2014-04-18 DIAGNOSIS — L700 Acne vulgaris: Secondary | ICD-10-CM

## 2014-04-18 DIAGNOSIS — Z0000 Encounter for general adult medical examination without abnormal findings: Secondary | ICD-10-CM

## 2014-04-18 DIAGNOSIS — Z23 Encounter for immunization: Secondary | ICD-10-CM

## 2014-04-18 LAB — URINALYSIS, ROUTINE W REFLEX MICROSCOPIC
BILIRUBIN URINE: NEGATIVE
Hgb urine dipstick: NEGATIVE
Ketones, ur: NEGATIVE
LEUKOCYTES UA: NEGATIVE
Nitrite: NEGATIVE
PH: 5.5 (ref 5.0–8.0)
RBC / HPF: NONE SEEN (ref 0–?)
SPECIFIC GRAVITY, URINE: 1.025 (ref 1.000–1.030)
Total Protein, Urine: NEGATIVE
URINE GLUCOSE: NEGATIVE
Urobilinogen, UA: 0.2 (ref 0.0–1.0)

## 2014-04-18 LAB — CBC WITH DIFFERENTIAL/PLATELET
BASOS ABS: 0 10*3/uL (ref 0.0–0.1)
Basophils Relative: 0.4 % (ref 0.0–3.0)
EOS ABS: 0 10*3/uL (ref 0.0–0.7)
Eosinophils Relative: 0.4 % (ref 0.0–5.0)
HCT: 37.6 % (ref 36.0–46.0)
HEMOGLOBIN: 12 g/dL (ref 12.0–15.0)
LYMPHS ABS: 1.2 10*3/uL (ref 0.7–4.0)
LYMPHS PCT: 13.9 % (ref 12.0–46.0)
MCHC: 32 g/dL (ref 30.0–36.0)
MCV: 81.3 fl (ref 78.0–100.0)
MONOS PCT: 4.9 % (ref 3.0–12.0)
Monocytes Absolute: 0.4 10*3/uL (ref 0.1–1.0)
NEUTROS ABS: 6.9 10*3/uL (ref 1.4–7.7)
Neutrophils Relative %: 80.4 % — ABNORMAL HIGH (ref 43.0–77.0)
PLATELETS: 218 10*3/uL (ref 150.0–400.0)
RBC: 4.62 Mil/uL (ref 3.87–5.11)
RDW: 15.5 % (ref 11.5–15.5)
WBC: 8.6 10*3/uL (ref 4.0–10.5)

## 2014-04-18 LAB — TSH: TSH: 0.76 u[IU]/mL (ref 0.35–4.50)

## 2014-04-18 MED ORDER — BENZOYL PEROXIDE-ERYTHROMYCIN 5-3 % EX GEL: Freq: Two times a day (BID) | CUTANEOUS | Status: DC

## 2014-04-18 MED ORDER — LUBIPROSTONE 8 MCG PO CAPS: 8.0000 ug | ORAL_CAPSULE | Freq: Two times a day (BID) | ORAL | Status: DC

## 2014-04-18 NOTE — Progress Notes (Signed)
Pre visit review using our clinic review tool, if applicable. No additional management support is needed unless otherwise documented below in the visit note. 

## 2014-04-18 NOTE — Progress Notes (Signed)
Subjective:    Patient ID: Nicole Bernard, female    DOB: 30-Mar-1981, 33 y.o.   MRN: [id number]  HPI  Patient presents today for complete physical.  Immunizations: tetanus up to date, flu shot today. Diet: healthy Exercise: exercises 4-5 days a week- cycle fitness Pap Smear: up to date Mammogram: to start at 48 [facility]- goes annually Dental- up to date  She is concerned re: acne on chin and is requesting hormonal study  Review of Systems  Constitutional:       Wt Readings from Last 3 Encounters: 04/18/14 : 184 lb 12.8 oz (83.825 kg) 01/10/13 : 180 lb 9.6 oz (81.92 kg) 01/03/13 : 181 lb (82.101 kg)   HENT: Negative for hearing loss and rhinorrhea.   Eyes: Negative for visual disturbance.  Respiratory: Negative for cough and shortness of breath.   Cardiovascular: Negative for chest pain.  Gastrointestinal: Positive for constipation. Negative for abdominal pain.       Chronic constipation- uses bathroom once a week.  Reports lots of fruits and veggies, regular exercise.    Genitourinary: Negative for dysuria, frequency and menstrual problem.  Musculoskeletal: Negative for myalgias and arthralgias.  Skin:       acne  Neurological: Negative for headaches.  Hematological: Negative for adenopathy.  Psychiatric/Behavioral:       Denies depression/anxiety   Past Medical History  Diagnosis Date  . Anemia     iron deficient  . Onychomycosis of toenail   . Overweight(278.02)   . Abnormal Pap smear of vagina   . History of miscarriage     History   Social History  . Marital Status: Married    Spouse Name: N/A    Number of Children: 2  . Years of Education: N/A   Occupational History  . nurse     [place]   Social History Main Topics  . Smoking status: Never Smoker   . Smokeless tobacco: Never Used  . Alcohol Use: Yes     Comment: social--rarely  . Drug Use: No  . Sexual Activity: Not on file   Other Topics Concern  . Not on file   Social History  Narrative   Regular exercise: no   Caffeine Use: no   Lives with husband and 2 children   Works at [employer]   Enjoys shopping                   Past Surgical History  Procedure Laterality Date  . Cesarean section    . Gynecologic cryosurgery  2003    Family History  Problem Relation Age of Onset  . Heart disease Neg Hx   . Hypertension Neg Hx   . Diabetes Neg Hx   . Cancer Neg Hx   . Stroke Neg Hx     Allergies  Allergen Reactions  . Latex Swelling    Swelling of lips and face.    Current Outpatient Prescriptions on File Prior to Visit  Medication Sig Dispense Refill  . cetirizine (ZYRTEC) 10 MG tablet Take 10 mg by mouth daily as needed.     . Multiple Vitamins-Minerals (MULTIVITAMIN WITH MINERALS) tablet Take 1 tablet by mouth daily.     No current facility-administered medications on file prior to visit.    BP 104/78 mmHg  Pulse 55  Temp(Src) 98.1 F (36.7 C) (Oral)  Resp 16  Ht 5\' 7"  (1.702 m)  Wt 184 lb 12.8 oz (83.825 kg)  BMI 28.94 kg/m2  SpO2  97%  LMP 04/07/2014       Objective:   Physical Exam  Physical Exam  Constitutional: She is oriented to person, place, and time. She appears well-developed and well-nourished. No distress.  HENT:  Head: Normocephalic and atraumatic.  Right Ear: Tympanic membrane and ear canal normal.  Left Ear: Tympanic membrane and ear canal normal.  Mouth/Throat: Oropharynx is clear and moist.  Eyes: Pupils are equal, round, and reactive to light. No scleral icterus.  Neck: Normal range of motion. No thyromegaly present.  Cardiovascular: Normal rate and regular rhythm.   No murmur heard. Pulmonary/Chest: Effort normal and breath sounds normal. No respiratory distress. He has no wheezes. She has no rales. She exhibits no tenderness.  Abdominal: Soft. Bowel sounds are normal. He exhibits no distension and no mass. There is no tenderness. There is no rebound and no guarding.  Musculoskeletal: She  exhibits no edema.  Lymphadenopathy:    She has no cervical adenopathy.  Neurological: She is alert and oriented to person, place, and time. She has normal patellar reflexes. She exhibits normal muscle tone. Coordination normal.  Skin: Skin is warm and dry. Acne noted on chin Psychiatric: She has a normal mood and affect. Her behavior is normal. Judgment and thought content normal.  Breasts: Examined lying Right: Without masses, retractions, discharge or axillary adenopathy.  Left: Without masses, retractions, discharge or axillary adenopathy.    Assessment & Plan:         Assessment & Plan:

## 2014-04-18 NOTE — Patient Instructions (Addendum)
Please complete lab work prior to leaving.  Start amitiza for constipation. Start benzemycin gel for acne. Follow up in 3 months.

## 2014-04-18 NOTE — Telephone Encounter (Signed)
Caller name: Shemeika, Starzyk Relation to pt: self Call back number:[phone] Pharmacy: [facility]  Reason for call: pt states she needs prior authorization for rx linzet states to please call [phone]. Pt states Melissa could not call in the rx because it needing pa.

## 2014-04-19 ENCOUNTER — Encounter: Payer: Self-pay | Admitting: Family

## 2014-04-19 LAB — TESTOSTERONE, FREE, TOTAL, SHBG
SEX HORMONE BINDING: 71 nmol/L (ref 18–114)
TESTOSTERONE: 32 ng/dL (ref 10–70)
Testosterone, Free: 3.4 pg/mL (ref 0.6–6.8)
Testosterone-% Free: 1.1 % (ref 0.4–2.4)

## 2014-04-19 LAB — LIPID PANEL
CHOL/HDL RATIO: 2
Cholesterol: 145 mg/dL (ref 0–200)
HDL: 60.1 mg/dL (ref >39.00)
LDL Cholesterol: 74 mg/dL (ref 0–99)
NonHDL: 84.9
Triglycerides: 54 mg/dL (ref 0.0–149.0)
VLDL: 10.8 mg/dL (ref 0.0–40.0)

## 2014-04-19 LAB — BASIC METABOLIC PANEL
BUN: 16 mg/dL (ref 6–23)
CALCIUM: 9.2 mg/dL (ref 8.4–10.5)
CO2: 24 meq/L (ref 19–32)
Chloride: 104 mEq/L (ref 96–112)
Creatinine, Ser: 0.8 mg/dL (ref 0.4–1.2)
GFR: 104.52 mL/min (ref >60.00)
GLUCOSE: 83 mg/dL (ref 70–99)
Potassium: 4.5 mEq/L (ref 3.5–5.1)
Sodium: 134 mEq/L — ABNORMAL LOW (ref 135–145)

## 2014-04-19 LAB — HEPATIC FUNCTION PANEL
ALBUMIN: 4 g/dL (ref 3.5–5.2)
ALK PHOS: 60 U/L (ref 39–117)
ALT: 17 U/L (ref 0–35)
AST: 18 U/L (ref 0–37)
BILIRUBIN TOTAL: 0.7 mg/dL (ref 0.2–1.2)
Bilirubin, Direct: 0.1 mg/dL (ref 0.0–0.3)
Total Protein: 7 g/dL (ref 6.0–8.3)

## 2014-04-19 MED ORDER — LINACLOTIDE 290 MCG PO CAPS
290.0000 ug | ORAL_CAPSULE | Freq: Every day | ORAL | Status: DC
Start: 2014-04-19 — End: 2014-06-21

## 2014-04-19 NOTE — Telephone Encounter (Signed)
I don't see Linzess on current or historic med list. Please advise.

## 2014-04-20 ENCOUNTER — Encounter: Payer: Self-pay | Admitting: Family

## 2014-04-20 DIAGNOSIS — L700 Acne vulgaris: Secondary | ICD-10-CM | POA: Insufficient documentation

## 2014-04-20 NOTE — Assessment & Plan Note (Addendum)
Continue healthy diet, exercise.  Due for flu shot today. Obtain routine lab work.

## 2014-04-20 NOTE — Assessment & Plan Note (Signed)
Trial of benzamycin gel.

## 2014-05-01 NOTE — Telephone Encounter (Signed)
PA initiated. Awaiting determination. JG//CMA 

## 2014-05-09 ENCOUNTER — Telehealth: Payer: Self-pay | Admitting: *Deleted

## 2014-05-09 NOTE — Telephone Encounter (Signed)
Attempted to contact pt, unable to leave message. Mailed letter.

## 2014-05-09 NOTE — Telephone Encounter (Signed)
-----   Message from [name], NP sent at 04/20/2014 10:40 PM EST ----- Regarding: FW: Your message may not be read Contact: [phone]   ----- Message -----    From: Generic Mychart    Sent: 04/20/2014  10:25 PM      To: [name], NP Subject: Your message may not be read                      ----- Delivery failure of internet email alert     ----- Failed to log attempted delivery of internet email alert  Tickler type: Message Message Id(WMG): [id number] SMTP Response: [id number] Patient: Nicole Bernard,Nicole Bernard([id number]) Recipient: () Internet alert email: [email or web address]     ----- Original WMG message to the patient ----- Sent: 04/20/2014  9:49 PM From: [name], NP To: Scheel,Lisset Message Type: User Message Subject: labs Sodium very mildly low.  We will plan to repeat sodium next visit.  I did send linzess to your pharmacy. Blood count, urine, liver, thyroid, cholesterol all look good.

## 2014-05-24 NOTE — Telephone Encounter (Signed)
PA denied. Alternative is Amitiza. Please advise. JG//CMA

## 2014-05-24 NOTE — Telephone Encounter (Signed)
amitiza rx was sent to her pharmacy previously. Please notify pt of below and advise her to start amitiza instead.

## 2014-05-24 NOTE — Telephone Encounter (Signed)
Notified pt and she voices understanding. 

## 2014-06-20 ENCOUNTER — Telehealth: Payer: Self-pay | Admitting: *Deleted

## 2014-06-20 NOTE — Telephone Encounter (Signed)
Received fax from [place] that [place] will require prior authorization. Insurance prefers:  Bisacodyl DR tablets, lactulose solution, metamucil powder, milk of magnesia, miralax packets, senna tablets or Sorbitol solution.  Left detailed message on pt's cell# to let us know which of the above alternatives she may have tried / failed.

## 2014-06-20 NOTE — Telephone Encounter (Signed)
Melissa-- please advise? 

## 2014-06-20 NOTE — Telephone Encounter (Signed)
I recommend that she try miralax as needed.  Rx pended below.

## 2014-06-20 NOTE — Telephone Encounter (Signed)
Pt returned your call, states she has not tried any of the alternatives but will try them, please call back.

## 2014-06-21 MED ORDER — POLYETHYLENE GLYCOL 3350 17 G PO PACK: 17.0000 g | PACK | Freq: Every day | ORAL | Status: DC | PRN

## 2014-06-21 NOTE — Telephone Encounter (Signed)
Notified pt and she voices understanding. 

## 2014-07-31 ENCOUNTER — Ambulatory Visit: Payer: No Typology Code available for payment source | Admitting: Family

## 2014-11-27 ENCOUNTER — Telehealth: Payer: Self-pay | Admitting: Family

## 2014-11-27 DIAGNOSIS — K429 Umbilical hernia without obstruction or gangrene: Secondary | ICD-10-CM

## 2014-11-27 NOTE — Telephone Encounter (Signed)
Caller name: Devri Kreher  Relation to pt: self  Call back number: [phone]   Reason for call:  Pt requesting a referral for Umbilical hernia. Pt last office visit 04/18/2014. Please advise

## 2015-04-23 ENCOUNTER — Encounter: Payer: No Typology Code available for payment source | Admitting: Family

## 2015-06-04 ENCOUNTER — Encounter: Payer: No Typology Code available for payment source | Admitting: Family

## 2015-06-06 ENCOUNTER — Encounter: Payer: Self-pay | Admitting: Family

## 2015-06-06 ENCOUNTER — Telehealth: Payer: Self-pay | Admitting: Family

## 2015-06-08 NOTE — Telephone Encounter (Signed)
Pt was no show 06/06/15 9:00am cpe appt, pt sent email thru mychart that she overslept, rescheduled for 06/13/15, charge or no charge?

## 2015-06-08 NOTE — Telephone Encounter (Signed)
No charge. 

## 2015-06-12 ENCOUNTER — Telehealth: Payer: Self-pay | Admitting: General Practice

## 2015-06-12 NOTE — Telephone Encounter (Signed)
Called pt for pre-visit phone call/unable to leave message for call back.

## 2015-06-13 ENCOUNTER — Encounter: Payer: Self-pay | Admitting: Family

## 2015-06-13 ENCOUNTER — Other Ambulatory Visit (HOSPITAL_COMMUNITY)
Admission: RE | Admit: 2015-06-13 | Discharge: 2015-06-13 | Disposition: A | Discharge status: Home / Self Care | Payer: BLUE CROSS/BLUE SHIELD | Source: Ambulatory Visit | Attending: Family | Admitting: Family

## 2015-06-13 ENCOUNTER — Ambulatory Visit (INDEPENDENT_AMBULATORY_CARE_PROVIDER_SITE_OTHER): Payer: BLUE CROSS/BLUE SHIELD | Admitting: Family

## 2015-06-13 VITALS — BP 112/65 | HR 53 | Temp 98.4°F | Resp 18 | Ht 65.0 in | Wt 184.4 lb

## 2015-06-13 DIAGNOSIS — Z1151 Encounter for screening for human papillomavirus (HPV): Secondary | ICD-10-CM | POA: Diagnosis not present

## 2015-06-13 DIAGNOSIS — Z01419 Encounter for gynecological examination (general) (routine) without abnormal findings: Secondary | ICD-10-CM | POA: Insufficient documentation

## 2015-06-13 DIAGNOSIS — Z0000 Encounter for general adult medical examination without abnormal findings: Secondary | ICD-10-CM

## 2015-06-13 LAB — CBC WITH DIFFERENTIAL/PLATELET
BASOS ABS: 0 10*3/uL (ref 0.0–0.1)
Basophils Relative: 0.4 % (ref 0.0–3.0)
Eosinophils Absolute: 0.1 10*3/uL (ref 0.0–0.7)
Eosinophils Relative: 1.5 % (ref 0.0–5.0)
HEMATOCRIT: 35.8 % — AB (ref 36.0–46.0)
HEMOGLOBIN: 11.6 g/dL — AB (ref 12.0–15.0)
LYMPHS PCT: 28.8 % (ref 12.0–46.0)
Lymphs Abs: 1.9 10*3/uL (ref 0.7–4.0)
MCHC: 32.3 g/dL (ref 30.0–36.0)
MCV: 81.6 fl (ref 78.0–100.0)
MONOS PCT: 4.8 % (ref 3.0–12.0)
Monocytes Absolute: 0.3 10*3/uL (ref 0.1–1.0)
Neutro Abs: 4.3 10*3/uL (ref 1.4–7.7)
Neutrophils Relative %: 64.5 % (ref 43.0–77.0)
Platelets: 245 10*3/uL (ref 150.0–400.0)
RBC: 4.39 Mil/uL (ref 3.87–5.11)
RDW: 14.7 % (ref 11.5–15.5)
WBC: 6.7 10*3/uL (ref 4.0–10.5)

## 2015-06-13 LAB — HEPATIC FUNCTION PANEL
ALBUMIN: 4 g/dL (ref 3.5–5.2)
ALK PHOS: 56 U/L (ref 39–117)
ALT: 22 U/L (ref 0–35)
AST: 21 U/L (ref 0–37)
Bilirubin, Direct: 0.1 mg/dL (ref 0.0–0.3)
TOTAL PROTEIN: 7.1 g/dL (ref 6.0–8.3)
Total Bilirubin: 0.4 mg/dL (ref 0.2–1.2)

## 2015-06-13 LAB — URINALYSIS, ROUTINE W REFLEX MICROSCOPIC
Bilirubin Urine: NEGATIVE
Hgb urine dipstick: NEGATIVE
Ketones, ur: NEGATIVE
Leukocytes, UA: NEGATIVE
Nitrite: NEGATIVE
PH: 5.5 (ref 5.0–8.0)
RBC / HPF: NONE SEEN (ref 0–?)
SPECIFIC GRAVITY, URINE: 1.02 (ref 1.000–1.030)
Total Protein, Urine: NEGATIVE
UROBILINOGEN UA: 0.2 (ref 0.0–1.0)
Urine Glucose: NEGATIVE
WBC, UA: NONE SEEN (ref 0–?)

## 2015-06-13 LAB — TSH: TSH: 0.54 u[IU]/mL (ref 0.35–4.50)

## 2015-06-13 LAB — BASIC METABOLIC PANEL
BUN: 19 mg/dL (ref 6–23)
CALCIUM: 8.9 mg/dL (ref 8.4–10.5)
CO2: 26 mEq/L (ref 19–32)
Chloride: 103 mEq/L (ref 96–112)
Creatinine, Ser: 0.78 mg/dL (ref 0.40–1.20)
GFR: 106.93 mL/min (ref >60.00)
GLUCOSE: 81 mg/dL (ref 70–99)
POTASSIUM: 3.7 meq/L (ref 3.5–5.1)
SODIUM: 136 meq/L (ref 135–145)

## 2015-06-13 LAB — LIPID PANEL
CHOL/HDL RATIO: 2
Cholesterol: 127 mg/dL (ref 0–200)
HDL: 65.6 mg/dL (ref >39.00)
LDL CALC: 52 mg/dL (ref 0–99)
NonHDL: 61.12
TRIGLYCERIDES: 48 mg/dL (ref 0.0–149.0)
VLDL: 9.6 mg/dL (ref 0.0–40.0)

## 2015-06-13 NOTE — Patient Instructions (Signed)
Please complete lab work prior to leaving.   

## 2015-06-13 NOTE — Progress Notes (Signed)
Subjective:    Patient ID: Nicole Bernard, female    DOB: 1980/09/28, 35 y.o.   MRN: [id number]  HPI  Nicole Bernard is a 35 yr old female who presents today for cpx.  Immunizations: declines flu, tetanus up today Diet: healthy Wt Readings from Last 3 Encounters:  06/13/15 184 lb 6.4 oz (83.643 kg)  04/18/14 184 lb 12.8 oz (83.825 kg)  01/10/13 180 lb 9.6 oz (81.92 kg)  Exercise: [employer] a [employer] Pap Smear: 12/2012- due Dental: up to date Eye exam: 12/16 Contraception- husband (vasectomy)  Review of Systems  Constitutional: Negative for unexpected weight change.  HENT: Negative for hearing loss and rhinorrhea.   Eyes: Negative for visual disturbance.  Respiratory: Negative for cough and shortness of breath.   Cardiovascular: Negative for chest pain.  Genitourinary: Negative for dysuria, frequency and menstrual problem.  Musculoskeletal: Negative for myalgias and arthralgias.       Occasional right foot swelling  Skin: Negative for rash.  Neurological: Negative for headaches.  Hematological: Negative for adenopathy.  Psychiatric/Behavioral:       Denies depression/anxiety   Past Medical History  Diagnosis Date  . Anemia     iron deficient  . Onychomycosis of toenail   . Overweight(278.02)   . Abnormal Pap smear of vagina   . History of miscarriage     Social History   Social History  . Marital Status: Married    Spouse Name: N/A  . Number of Children: 2  . Years of Education: N/A   Occupational History  . nurse     [employer]   Social History Main Topics  . Smoking status: Never Smoker   . Smokeless tobacco: Never Used  . Alcohol Use: No  . Drug Use: No  . Sexual Activity: Not on file   Other Topics Concern  . 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 Cesarean section    . Gynecologic cryosurgery  2003    Family History  Problem Relation Age of Onset  . Heart disease Neg Hx   . Hypertension Neg Hx   . Diabetes Neg Hx   . Cancer Neg Hx   . Stroke Neg Hx     Allergies  Allergen Reactions  . Latex Swelling    Swelling of lips and face.    Current Outpatient Prescriptions on File Prior to Visit  Medication Sig Dispense Refill  . Multiple Vitamins-Minerals (MULTIVITAMIN WITH MINERALS) tablet Take 1 tablet by mouth daily. Reported on 06/13/2015     No current facility-administered medications on file prior to visit.    BP 112/65 mmHg  Pulse 53  Temp(Src) 98.4 F (36.9 C) (Oral)  Resp 18  Ht 5\' 5"  (1.651 m)  Wt 184 lb 6.4 oz (83.643 kg)  BMI 30.69 kg/m2  SpO2 100%  LMP 06/06/2015       Objective:   Physical Exam  Physical Exam  Constitutional: She is oriented to person, place, and time. She appears well-developed and well-nourished. No distress.  HENT:  Head: Normocephalic and atraumatic.  Right Ear: Tympanic membrane and ear canal normal.  Left  Ear: Tympanic membrane and ear canal normal.  Mouth/Throat: Oropharynx is clear and moist.  Eyes: Pupils are equal, round, and reactive to light. No scleral icterus.  Neck: Normal range of motion. No thyromegaly present.  Cardiovascular: Normal rate and regular rhythm.   No murmur heard. Pulmonary/Chest: Effort normal and breath sounds normal. No respiratory distress. He has no wheezes. She has no rales. She exhibits no tenderness.  Abdominal: Soft. Bowel sounds are normal. He exhibits no distension and no mass. There is no tenderness. There is no rebound and no guarding.  Musculoskeletal: She exhibits no edema.  Lymphadenopathy:    She has no cervical adenopathy.  Neurological: She is alert and oriented to person, place, and time. She has normal patellar reflexes. She exhibits normal muscle tone. Coordination normal.    Skin: Skin is warm and dry.  Psychiatric: She has a normal mood and affect. Her behavior is normal. Judgment and thought content normal.  Breasts: Examined lying Right: Without masses, retractions, discharge or axillary adenopathy.  Left: Without masses, retractions, discharge or axillary adenopathy.  Inguinal/mons: Normal without inguinal adenopathy  External genitalia: Normal  BUS/Urethra/Skene's glands: Normal  Bladder: Normal  Vagina: Normal  Cervix: slightly irregular contour from prior cryosurgery Uterus: normal in size, shape and contour. Midline and mobile  Adnexa/parametria:  Rt: Without masses or tenderness.  Lt: Without masses or tenderness.  Anus and perineum: Normal           Assessment & Plan:        Assessment & Plan:

## 2015-06-13 NOTE — Progress Notes (Signed)
Pre visit review using our clinic review tool, if applicable. No additional management support is needed unless otherwise documented below in the visit note. 

## 2015-06-13 NOTE — Addendum Note (Signed)
Addended by: Kelle Darting A on: 06/13/2015 02:42 PM   Modules accepted: Orders

## 2015-06-13 NOTE — Assessment & Plan Note (Signed)
Discussed healthy diet, exercise, weight loss. Obtain routine lab work. Pap performed today.

## 2015-06-14 ENCOUNTER — Encounter: Payer: Self-pay | Admitting: Family

## 2015-06-14 LAB — HIV ANTIBODY (ROUTINE TESTING W REFLEX): HIV 1&2 Ab, 4th Generation: NONREACTIVE

## 2015-06-18 LAB — CYTOLOGY - PAP

## 2015-07-02 ENCOUNTER — Encounter: Payer: Self-pay | Admitting: Family

## 2016-01-01 ENCOUNTER — Ambulatory Visit (INDEPENDENT_AMBULATORY_CARE_PROVIDER_SITE_OTHER): Payer: BLUE CROSS/BLUE SHIELD | Admitting: Medical

## 2016-01-01 VITALS — BP 105/69 | HR 77 | Temp 98.7°F | Ht 65.0 in | Wt 179.4 lb

## 2016-01-01 DIAGNOSIS — T7840XA Allergy, unspecified, initial encounter: Secondary | ICD-10-CM | POA: Diagnosis not present

## 2016-01-01 MED ORDER — PREDNISONE 10 MG PO TABS: ORAL_TABLET | ORAL | 0 refills | Status: DC

## 2016-01-01 MED ORDER — HYDROXYZINE HCL 25 MG PO TABS: 25.0000 mg | ORAL_TABLET | Freq: Three times a day (TID) | ORAL | 0 refills | Status: DC | PRN

## 2016-01-01 MED ORDER — METHYLPREDNISOLONE ACETATE 40 MG/ML IJ SUSP
40.0000 mg | Freq: Once | INTRAMUSCULAR | Status: AC
  Administered 2016-01-01: 40 mg via INTRAMUSCULAR

## 2016-01-01 NOTE — Progress Notes (Signed)
Subjective:    Patient ID: Nicole Bernard, female    DOB: Sep 23, 1980, 35 y.o.   MRN: [id number]  HPI   Pt in with rash since Friday. Started on her flank and back area. Then under her breast. Now some on her face. And upper chest.   Pt used some back and body spray. She poured it on her hands. She described in past used in spray form but recently used in more concentrated manner.  Pt has recent rash that itches. Reports rash occurred after no known particular exposure. On review pt does not report any suspicious exposure to soaps, creams, detergents, make up, , animal exposure exposure, plants or insect bites.  Pt rash is in the area of. Pt reports no shortness of breath or wheezing. . Pt is not diabetic.  Pt states in past when young had some reactions to detergents.   LMP- About 2 weeks ago. This was normal.  No recent antibiotic in past month. No fever or st.       Review of Systems  Constitutional: Negative for chills, fatigue and fever.  HENT: Negative for congestion, ear pain, postnasal drip, sinus pressure and sore throat.   Respiratory: Negative for cough, chest tightness, shortness of breath and wheezing.   Cardiovascular: Negative for chest pain and palpitations.  Gastrointestinal: Negative for abdominal pain, blood in stool, nausea and vomiting.  Musculoskeletal: Negative for back pain and myalgias.  Skin: Positive for rash.       With itching.  Neurological: Negative for dizziness and headaches.  Hematological: Negative for adenopathy. Does not bruise/bleed easily.  Psychiatric/Behavioral: Negative for behavioral problems, confusion and hallucinations.    Past Medical History:  Diagnosis Date  . Abnormal Pap smear of vagina   . Anemia    iron deficient  . History of miscarriage   . Onychomycosis of toenail   . Overweight(278.02)      Social History   Social History  . Marital status: Married    Spouse name: N/A  . Number of children: 2  . Years of  education: N/A   Occupational History  . nurse     [employer]   Social History Main Topics  . Smoking status: Never Smoker  . Smokeless tobacco: Never Used  . Alcohol use No  . Drug use: No  . Sexual activity: Not on file   Other Topics Concern  . 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 CESAREAN SECTION    . GYNECOLOGIC CRYOSURGERY  2003    Family History  Problem Relation Age of Onset  . Heart disease Neg Hx   . Hypertension Neg Hx   . Diabetes Neg Hx   . Cancer Neg Hx   . Stroke Neg Hx     Allergies  Allergen Reactions  . Latex Swelling    Swelling of lips and face.    Current Outpatient Prescriptions on File Prior to Visit  Medication Sig Dispense Refill  . Multiple Vitamins-Minerals (MULTIVITAMIN WITH MINERALS) tablet Take 1 tablet by mouth daily. Reported on 06/13/2015     No current facility-administered medications on file prior  to visit.     BP 105/69   Pulse 77   Temp 98.7 F (37.1 C) (Oral)   Ht 5\' 5"  (1.651 m)   Wt 179 lb 6.4 oz (81.4 kg)   LMP 12/16/2015   SpO2 100%   BMI 29.85 kg/m       Objective:   Physical Exam  General- No acute distress. Pleasant patient. Neck- Full range of motion, no jvd Mouth- no swelling of buccal mucosa. Lungs- Clear, even and unlabored. Heart- regular rate and rhythm. Neurologic- CNII- XII grossly intact. Skin- diffuse  Mild red rash all over  upper chest, arms, face, upper back and legs.       Assessment & Plan:  The etiology of your  allergic reaction is  suspicious for body spray(so please stop use) . We gave you depo-medrol im injection. I am also prescribing oral prednisone and hydroxyzine for itching. Your rash should gradually improve. If worsening or  expanding  please notify us. If your rash reoccurs intermittently and no cause is identified then could consider allergist referral.  Follow up in 7 days or as needed.  Chiniqua Kilcrease, Percell Miller, PA-C

## 2016-01-01 NOTE — Progress Notes (Signed)
Pre visit review using our clinic tool,if applicable. No additional management support is needed unless otherwise documented below in the visit note.  

## 2016-01-01 NOTE — Patient Instructions (Addendum)
The etiology of your  allergic reaction is  suspicious for body spray(so please stop use) . We gave you depo-medrol im injection. I am also prescribing oral prednisone and hydroxyzine for itching. Your rash should gradually improve. If worsening or expanding  please notify us. If your rash reoccurs intermittently and no cause is identified then could consider allergist referral.  Follow up in 7 days or as needed.

## 2016-06-13 ENCOUNTER — Encounter: Payer: BLUE CROSS/BLUE SHIELD | Admitting: Family

## 2016-06-16 ENCOUNTER — Encounter: Payer: Self-pay | Admitting: Family

## 2016-06-16 ENCOUNTER — Ambulatory Visit (INDEPENDENT_AMBULATORY_CARE_PROVIDER_SITE_OTHER): Payer: BLUE CROSS/BLUE SHIELD | Admitting: Family

## 2016-06-16 ENCOUNTER — Encounter: Payer: BLUE CROSS/BLUE SHIELD | Admitting: Family

## 2016-06-16 VITALS — BP 99/67 | HR 50 | Temp 98.4°F | Resp 16 | Ht 65.5 in | Wt 169.8 lb

## 2016-06-16 DIAGNOSIS — Z0000 Encounter for general adult medical examination without abnormal findings: Secondary | ICD-10-CM | POA: Diagnosis not present

## 2016-06-16 DIAGNOSIS — Z23 Encounter for immunization: Secondary | ICD-10-CM | POA: Diagnosis not present

## 2016-06-16 DIAGNOSIS — K429 Umbilical hernia without obstruction or gangrene: Secondary | ICD-10-CM | POA: Diagnosis not present

## 2016-06-16 LAB — CBC WITH DIFFERENTIAL/PLATELET
BASOS ABS: 0 10*3/uL (ref 0.0–0.1)
BASOS PCT: 0.6 % (ref 0.0–3.0)
EOS ABS: 0.1 10*3/uL (ref 0.0–0.7)
Eosinophils Relative: 1.4 % (ref 0.0–5.0)
HEMATOCRIT: 35.4 % — AB (ref 36.0–46.0)
HEMOGLOBIN: 11.6 g/dL — AB (ref 12.0–15.0)
LYMPHS PCT: 19 % (ref 12.0–46.0)
Lymphs Abs: 1.3 10*3/uL (ref 0.7–4.0)
MCHC: 32.8 g/dL (ref 30.0–36.0)
MCV: 81.3 fl (ref 78.0–100.0)
Monocytes Absolute: 0.4 10*3/uL (ref 0.1–1.0)
Monocytes Relative: 5.5 % (ref 3.0–12.0)
Neutro Abs: 5.2 10*3/uL (ref 1.4–7.7)
Neutrophils Relative %: 73.5 % (ref 43.0–77.0)
Platelets: 195 10*3/uL (ref 150.0–400.0)
RBC: 4.35 Mil/uL (ref 3.87–5.11)
RDW: 14.9 % (ref 11.5–15.5)
WBC: 7.1 10*3/uL (ref 4.0–10.5)

## 2016-06-16 LAB — URINALYSIS, ROUTINE W REFLEX MICROSCOPIC
Bilirubin Urine: NEGATIVE
Hgb urine dipstick: NEGATIVE
KETONES UR: NEGATIVE
Leukocytes, UA: NEGATIVE
Nitrite: NEGATIVE
PH: 6 (ref 5.0–8.0)
RBC / HPF: NONE SEEN (ref 0–?)
Specific Gravity, Urine: 1.025 (ref 1.000–1.030)
TOTAL PROTEIN, URINE-UPE24: NEGATIVE
Urine Glucose: NEGATIVE
Urobilinogen, UA: 1 (ref 0.0–1.0)
WBC, UA: NONE SEEN (ref 0–?)

## 2016-06-16 LAB — LIPID PANEL
CHOL/HDL RATIO: 2
Cholesterol: 125 mg/dL (ref 0–200)
HDL: 72.7 mg/dL (ref >39.00)
LDL Cholesterol: 46 mg/dL (ref 0–99)
NONHDL: 51.9
Triglycerides: 31 mg/dL (ref 0.0–149.0)
VLDL: 6.2 mg/dL (ref 0.0–40.0)

## 2016-06-16 LAB — BASIC METABOLIC PANEL
BUN: 14 mg/dL (ref 6–23)
CHLORIDE: 108 meq/L (ref 96–112)
CO2: 26 mEq/L (ref 19–32)
CREATININE: 0.92 mg/dL (ref 0.40–1.20)
Calcium: 9.2 mg/dL (ref 8.4–10.5)
GFR: 87.9 mL/min (ref >60.00)
Glucose, Bld: 95 mg/dL (ref 70–99)
POTASSIUM: 4.1 meq/L (ref 3.5–5.1)
Sodium: 139 mEq/L (ref 135–145)

## 2016-06-16 LAB — HEPATIC FUNCTION PANEL
ALK PHOS: 47 U/L (ref 39–117)
ALT: 27 U/L (ref 0–35)
AST: 22 U/L (ref 0–37)
Albumin: 4 g/dL (ref 3.5–5.2)
BILIRUBIN DIRECT: 0.1 mg/dL (ref 0.0–0.3)
Total Bilirubin: 0.5 mg/dL (ref 0.2–1.2)
Total Protein: 7 g/dL (ref 6.0–8.3)

## 2016-06-16 LAB — TSH: TSH: 0.67 u[IU]/mL (ref 0.35–4.50)

## 2016-06-16 NOTE — Progress Notes (Signed)
Pre visit review using our clinic review tool, if applicable. No additional management support is needed unless otherwise documented below in the visit note. 

## 2016-06-16 NOTE — Patient Instructions (Signed)
Continue healthy diet and regular exercise. You will be contacted about your referral to the surgeon.

## 2016-06-16 NOTE — Addendum Note (Signed)
Addended by: Kelle Darting A on: 06/16/2016 10:43 AM   Modules accepted: Orders

## 2016-06-16 NOTE — Progress Notes (Signed)
Subjective:    Patient ID: Nicole Bernard, female    DOB: 02/17/1981, 36 y.o.   MRN: [id number]  HPI  Patient presents today for complete physical.  Immunizations: due for tetanus and flu shot Diet: healthy Wt Readings from Last 3 Encounters:  06/16/16 169 lb 12.8 oz (77 kg)  01/01/16 179 lb 6.4 oz (81.4 kg)  06/13/15 184 lb 6.4 oz (83.6 kg)  Exercise: weight training, cardio 5-6 days a week Pap Smear:  06/13/15 Vision: has apt on Saturday Dental:  Up to date   Umbilical hernia- notes that this has been bothering her more recently.  Especially when she is lifting weights it "really bulges out" and she has to push it back in.  She is interested in Tree surgeon.    Review of Systems  Constitutional: Negative for unexpected weight change.  HENT: Negative for rhinorrhea.   Eyes: Negative for visual disturbance.  Respiratory: Negative for cough.   Cardiovascular: Negative for leg swelling.  Gastrointestinal: Negative for blood in stool, constipation and diarrhea.  Genitourinary: Negative for frequency and menstrual problem.  Musculoskeletal: Negative for arthralgias and myalgias.  Skin: Negative for rash.  Neurological: Negative for headaches.  Hematological: Negative for adenopathy.  Psychiatric/Behavioral:       Denies depression/anxiety   Past Medical History:  Diagnosis Date  . Abnormal Pap smear of vagina   . Anemia    iron deficient  . History of miscarriage   . Onychomycosis of toenail   . Overweight(278.02)      Social History   Social History  . Marital status: Married    Spouse name: N/A  . Number of children: 2  . Years of education: N/A   Occupational History  . nurse     [employer]   Social History Main Topics  . Smoking status: Never Smoker  . Smokeless tobacco: Never Used  . Alcohol use No  . Drug use: No  . Sexual activity: Not on file   Other Topics Concern  . 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 CESAREAN SECTION    . GYNECOLOGIC CRYOSURGERY  2003    Family History  Problem Relation Age of Onset  . Heart disease Neg Hx   . Hypertension Neg Hx   . Diabetes Neg Hx   . Cancer Neg Hx   . Stroke Neg Hx     Allergies  Allergen Reactions  . Latex Swelling    Swelling of lips and face.    Current Outpatient Prescriptions on File Prior to Visit  Medication Sig Dispense Refill  . Multiple Vitamins-Minerals (MULTIVITAMIN WITH MINERALS) tablet Take 1 tablet by mouth daily. Reported on 06/13/2015     No current facility-administered medications on file prior to visit.     BP 99/67 (BP Location: Left Arm, Cuff Size: Large)   Pulse (!) 50   Temp 98.4 F (36.9 C) (Oral)   Resp 16   Ht 5' 5.5" (1.664 m)   Wt 169 lb 12.8 oz (77 kg)   LMP 06/16/2016   BMI 27.83 kg/m        Objective:  Physical Exam Physical Exam  Constitutional: She is oriented to person, place, and time. She appears well-developed and well-nourished. No distress.  HENT:  Head: Normocephalic and atraumatic.  Right Ear: Tympanic membrane and ear canal normal.  Left Ear: Tympanic membrane and ear canal normal.  Mouth/Throat: Oropharynx is clear and moist.  Eyes: Pupils are equal, round, and reactive to light. No scleral icterus.  Neck: Normal range of motion. No thyromegaly present.  Cardiovascular: Normal rate and regular rhythm.   No murmur heard. Pulmonary/Chest: Effort normal and breath sounds normal. No respiratory distress. He has no wheezes. She has no rales. She exhibits no tenderness.  Abdominal: Soft. Bowel sounds are normal. She exhibits no distension. Reducible umbilical hernia is noted. There is no tenderness. There is no rebound and no guarding.  Musculoskeletal: She exhibits no  edema.  Lymphadenopathy:    She has no cervical adenopathy.  Neurological: She is alert and oriented to person, place, and time. She has normal patellar reflexes. She exhibits normal muscle tone. Coordination normal.  Skin: Skin is warm and dry.  Psychiatric: She has a normal mood and affect. Her behavior is normal. Judgment and thought content normal.  Breasts: Examined lying Right: Without masses, retractions, discharge or axillary adenopathy.  Left: Without masses, retractions, discharge or axillary adenopathy.            Assessment & Plan:          Assessment & Plan:  Preventative Care- encouraged pt to continue healthy diet, exercise.  Obtain routine labs. Flu shot and Tdap today.    Umbilical hernia- will refer to general surgeon for elective repair.

## 2016-06-30 NOTE — Telephone Encounter (Signed)
Attempted to reach pt. No voicemail set up. Mailed letter to pt.

## 2016-06-30 NOTE — Telephone Encounter (Signed)
pls contact pt re: unread mychart message. 

## 2016-07-04 ENCOUNTER — Encounter: Payer: Self-pay | Admitting: Family

## 2017-02-24 ENCOUNTER — Encounter: Payer: Self-pay | Admitting: Family

## 2017-02-24 DIAGNOSIS — K429 Umbilical hernia without obstruction or gangrene: Secondary | ICD-10-CM

## 2017-03-11 DIAGNOSIS — K429 Umbilical hernia without obstruction or gangrene: Secondary | ICD-10-CM | POA: Diagnosis not present

## 2017-05-04 DIAGNOSIS — K429 Umbilical hernia without obstruction or gangrene: Secondary | ICD-10-CM | POA: Diagnosis not present

## 2017-05-04 HISTORY — PX: UMBILICAL HERNIA REPAIR: SHX196

## 2017-06-19 ENCOUNTER — Ambulatory Visit (INDEPENDENT_AMBULATORY_CARE_PROVIDER_SITE_OTHER): Payer: BLUE CROSS/BLUE SHIELD | Admitting: Family

## 2017-06-19 ENCOUNTER — Encounter: Payer: Self-pay | Admitting: Family

## 2017-06-19 VITALS — BP 113/75 | HR 61 | Temp 98.0°F | Resp 16 | Ht 65.5 in | Wt 191.4 lb

## 2017-06-19 DIAGNOSIS — Z0000 Encounter for general adult medical examination without abnormal findings: Secondary | ICD-10-CM

## 2017-06-19 LAB — URINALYSIS, ROUTINE W REFLEX MICROSCOPIC
BILIRUBIN URINE: NEGATIVE
HGB URINE DIPSTICK: NEGATIVE
Ketones, ur: NEGATIVE
LEUKOCYTES UA: NEGATIVE
NITRITE: NEGATIVE
RBC / HPF: NONE SEEN (ref 0–?)
Specific Gravity, Urine: <=1.005 — AB (ref 1.000–1.030)
TOTAL PROTEIN, URINE-UPE24: NEGATIVE
URINE GLUCOSE: NEGATIVE
Urobilinogen, UA: 0.2 (ref 0.0–1.0)
pH: 6.5 (ref 5.0–8.0)

## 2017-06-19 LAB — BASIC METABOLIC PANEL
BUN: 9 mg/dL (ref 6–23)
CHLORIDE: 102 meq/L (ref 96–112)
CO2: 30 mEq/L (ref 19–32)
Calcium: 9.4 mg/dL (ref 8.4–10.5)
Creatinine, Ser: 0.92 mg/dL (ref 0.40–1.20)
GFR: 87.43 mL/min (ref >60.00)
Glucose, Bld: 83 mg/dL (ref 70–99)
POTASSIUM: 4.7 meq/L (ref 3.5–5.1)
Sodium: 137 mEq/L (ref 135–145)

## 2017-06-19 LAB — HEPATIC FUNCTION PANEL
ALT: 11 U/L (ref 0–35)
AST: 12 U/L (ref 0–37)
Albumin: 4 g/dL (ref 3.5–5.2)
Alkaline Phosphatase: 59 U/L (ref 39–117)
BILIRUBIN DIRECT: 0.2 mg/dL (ref 0.0–0.3)
BILIRUBIN TOTAL: 0.9 mg/dL (ref 0.2–1.2)
Total Protein: 7.3 g/dL (ref 6.0–8.3)

## 2017-06-19 LAB — CBC WITH DIFFERENTIAL/PLATELET
BASOS PCT: 0.6 % (ref 0.0–3.0)
Basophils Absolute: 0 10*3/uL (ref 0.0–0.1)
EOS ABS: 0.1 10*3/uL (ref 0.0–0.7)
Eosinophils Relative: 2.3 % (ref 0.0–5.0)
HCT: 39 % (ref 36.0–46.0)
Hemoglobin: 12.7 g/dL (ref 12.0–15.0)
LYMPHS ABS: 1.2 10*3/uL (ref 0.7–4.0)
Lymphocytes Relative: 23.8 % (ref 12.0–46.0)
MCHC: 32.5 g/dL (ref 30.0–36.0)
MCV: 81.2 fl (ref 78.0–100.0)
MONO ABS: 0.3 10*3/uL (ref 0.1–1.0)
Monocytes Relative: 6.5 % (ref 3.0–12.0)
NEUTROS ABS: 3.4 10*3/uL (ref 1.4–7.7)
NEUTROS PCT: 66.8 % (ref 43.0–77.0)
PLATELETS: 206 10*3/uL (ref 150.0–400.0)
RBC: 4.8 Mil/uL (ref 3.87–5.11)
RDW: 13.8 % (ref 11.5–15.5)
WBC: 5.1 10*3/uL (ref 4.0–10.5)

## 2017-06-19 LAB — TSH: TSH: 0.56 u[IU]/mL (ref 0.35–4.50)

## 2017-06-19 LAB — LIPID PANEL
Cholesterol: 120 mg/dL (ref 0–200)
HDL: 66.4 mg/dL (ref >39.00)
LDL Cholesterol: 43 mg/dL (ref 0–99)
NonHDL: 53.53
Total CHOL/HDL Ratio: 2
Triglycerides: 52 mg/dL (ref 0.0–149.0)
VLDL: 10.4 mg/dL (ref 0.0–40.0)

## 2017-06-19 NOTE — Patient Instructions (Signed)
Please work on [employer], exercise, weight loss. Complete lab work prior to leaving.

## 2017-06-19 NOTE — Progress Notes (Signed)
Subjective:    Patient ID: Nicole Bernard, female    DOB: May 20, 1980, 37 y.o.   MRN: [id number]  HPI   Patient presents today for complete physical.  Immunizations: tdap up to date, declines flu shot Wt Readings from Last 3 Encounters:  06/19/17 191 lb 6.4 oz (86.8 kg)  06/16/16 169 lb 12.8 oz (77 kg)  01/01/16 179 lb 6.4 oz (81.4 kg)  Diet: reports that diet is healthy.   Exercise: Will return to the gym, was out due to surgery Pap Smear: 06/16/15 Dental: up to date  Review of Systems  Constitutional: Negative for unexpected weight change.  HENT: Negative for hearing loss and rhinorrhea.   Eyes: Negative for visual disturbance.  Respiratory: Negative for cough.   Cardiovascular: Negative for leg swelling.  Gastrointestinal: Negative for constipation and diarrhea.  Genitourinary: Negative for dysuria and menstrual problem.  Musculoskeletal: Negative for arthralgias and myalgias.  Skin: Negative for rash.  Neurological: Negative for headaches.  Hematological: Negative for adenopathy.  Psychiatric/Behavioral:       Denies depression/anxiety       Past Medical History:  Diagnosis Date  . Abnormal Pap smear of vagina   . Anemia    iron deficient  . History of miscarriage   . Onychomycosis of toenail   . Overweight(278.02)      Social History   Socioeconomic History  . Marital status: Married    Spouse name: Not on file  . Number of children: 2  . Years of education: Not on file  . Highest education level: Not on file  Social Needs  . Financial resource strain: Not on file  . Food insecurity - worry: Not on file  . Food insecurity - inability: Not on file  . Transportation needs - medical: Not on file  . Transportation needs - non-medical: Not on file  Occupational History  . Occupation: nurse    Comment: [employer]  Tobacco Use  . Smoking status: Never Smoker  . Smokeless tobacco: Never Used  Substance and Sexual Activity  . Alcohol use: No   Alcohol/week: 0.0 oz  . Drug use: No  . Sexual activity: Yes    Partners: Male    Birth control/protection: Other-see comments    Comment: husband had vasectomy  Other Topics Concern  . Not on file  Social History Narrative   Regular exercise: no   Caffeine Use: no   Lives with husband and 2 children (twin girls 2006)   Works at [employer] (SNF),  She is an Corporate treasurer   Enjoys shopping                Past Surgical History:  Procedure Laterality Date  . CESAREAN SECTION    . GYNECOLOGIC CRYOSURGERY  2003  . UMBILICAL HERNIA REPAIR  05/04/2017    Family History  Problem Relation Age of Onset  . Heart disease Neg Hx   . Hypertension Neg Hx   . Diabetes Neg Hx   . Cancer Neg Hx   . Stroke Neg Hx     Allergies  Allergen Reactions  . Latex Swelling    Swelling of lips and face.    Current Outpatient Medications on File Prior to Visit  Medication Sig Dispense Refill  . Multiple Vitamins-Minerals (MULTIVITAMIN WITH MINERALS) tablet Take 1 tablet by mouth daily. Reported on 06/13/2015     No current facility-administered medications on file prior to visit.     BP 113/75 (BP Location: Left Arm, Patient Position:  Sitting, Cuff Size: Large)   Pulse 61   Temp 98 F (36.7 C) (Oral)   Resp 16   Ht 5' 5.5" (1.664 m)   Wt 191 lb 6.4 oz (86.8 kg)   SpO2 98%   BMI 31.37 kg/m    Objective:   Physical Exam  Physical Exam  Constitutional: She is oriented to person, place, and time. She appears well-developed and well-nourished. No distress.  HENT:  Head: Normocephalic and atraumatic.  Right Ear: Tympanic membrane and ear canal normal.  Left Ear: Tympanic membrane and ear canal normal.  Mouth/Throat: Oropharynx is clear and moist.  Eyes: Pupils are equal, round, and reactive to light. No scleral icterus.  Neck: Normal range of motion. No thyromegaly present.  Cardiovascular: Normal rate and regular rhythm.   No murmur heard. Pulmonary/Chest: Effort normal and  breath sounds normal. No respiratory distress. He has no wheezes. She has no rales. She exhibits no tenderness.  Abdominal: Soft. Bowel sounds are normal. She exhibits no distension and no mass. There is no tenderness. There is no rebound and no guarding.  Musculoskeletal: She exhibits no edema.  Lymphadenopathy:    She has no cervical adenopathy.  Neurological: She is alert and oriented to person, place, and time. She has normal patellar reflexes. She exhibits normal muscle tone. Coordination normal.  Skin: Skin is warm and dry.  Psychiatric: She has a normal mood and affect. Her behavior is normal. Judgment and thought content normal.  Breasts: Examined lying Right: Without masses, retractions, discharge or axillary adenopathy.  Left: Without masses, retractions, discharge or axillary adenopathy.  Pelvic: deferred        Assessment & Plan:         Assessment & Plan:  Preventative Care- discussed healthy diet, exercise, weight loss.  Tdap up to date, declines flu shot. Pap up to date. Discussed BSE.

## 2017-11-05 ENCOUNTER — Encounter: Payer: Self-pay | Admitting: Family

## 2017-11-20 ENCOUNTER — Ambulatory Visit: Payer: BLUE CROSS/BLUE SHIELD | Admitting: Family

## 2017-11-20 ENCOUNTER — Encounter: Payer: Self-pay | Admitting: Family

## 2017-11-20 ENCOUNTER — Ambulatory Visit (HOSPITAL_BASED_OUTPATIENT_CLINIC_OR_DEPARTMENT_OTHER)
Admission: RE | Admit: 2017-11-20 | Discharge: 2017-11-20 | Disposition: A | Discharge status: Home / Self Care | Payer: BLUE CROSS/BLUE SHIELD | Source: Ambulatory Visit | Attending: Family | Admitting: Family

## 2017-11-20 VITALS — BP 110/80 | HR 61 | Temp 98.0°F | Resp 16 | Ht 65.5 in | Wt 211.4 lb

## 2017-11-20 DIAGNOSIS — M7989 Other specified soft tissue disorders: Secondary | ICD-10-CM | POA: Diagnosis not present

## 2017-11-20 DIAGNOSIS — R600 Localized edema: Secondary | ICD-10-CM | POA: Diagnosis not present

## 2017-11-20 NOTE — Progress Notes (Signed)
Subjective:    Patient ID: Nicole Bernard, female    DOB: [birth year], 37 y.o.   MRN: [id number]  HPI  Patient is a 37 year old female who presents today with chief complaint of right foot swelling.  She reports that swelling is worse after standing and walking.  She has been elevating her foot at night.  Symptoms have been worsening over the last few months. She denies SOB/calf pain, chest pain.  Denies injury to the RLE.    Review of Systems See HPI  Past Medical History:  Diagnosis Date  . Abnormal Pap smear of vagina   . Anemia    iron deficient  . History of miscarriage   . Onychomycosis of toenail   . Overweight(278.02)      Social History   Socioeconomic History  . Marital status: Married    Spouse name: Not on file  . Number of children: 2  . Years of education: Not on file  . Highest education level: Not on file  Occupational History  . Occupation: nurse    Comment: [employer]  Social Needs  . Financial resource strain: Not on file  . Food insecurity:    Worry: Not on file    Inability: Not on file  . Transportation needs:    Medical: Not on file    Non-medical: Not on file  Tobacco Use  . Smoking status: Never Smoker  . Smokeless tobacco: Never Used  Substance and Sexual Activity  . Alcohol use: No    Alcohol/week: 0.0 oz  . Drug use: No  . Sexual activity: Yes    Partners: Male    Birth control/protection: Other-see comments    Comment: husband had vasectomy  Lifestyle  . Physical activity:    Days per week: Not on file    Minutes per session: Not on file  . Stress: Not on file  Relationships  . Social connections:    Talks on phone: Not on file    Gets together: Not on file    Attends religious service: Not on file    Active member of club or organization: Not on file    Attends meetings of clubs or organizations: Not on file    Relationship status: Not on file  . Intimate partner violence:    Fear of current or ex partner: Not on file   Emotionally abused: Not on file    Physically abused: Not on file    Forced sexual activity: Not on file  Other Topics Concern  . 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 CESAREAN SECTION    . GYNECOLOGIC CRYOSURGERY  2003  . UMBILICAL HERNIA REPAIR  05/04/2017    Family History  Problem Relation Age of Onset  . Heart disease Neg Hx   . Hypertension Neg Hx   . Diabetes Neg Hx   . Cancer Neg Hx   . Stroke Neg Hx     Allergies  Allergen Reactions  . Latex Swelling    Swelling of lips and face.    Current Outpatient Medications on File Prior to Visit  Medication Sig Dispense Refill  .  Multiple Vitamins-Minerals (MULTIVITAMIN WITH MINERALS) tablet Take 1 tablet by mouth daily. Reported on 06/13/2015     No current facility-administered medications on file prior to visit.     BP 110/80 (BP Location: Right Arm, Patient Position: Sitting, Cuff Size: Large)   Pulse 61   Temp 98 F (36.7 C) (Oral)   Resp 16   Ht 5' 5.5" (1.664 m)   Wt 211 lb 6.4 oz (95.9 kg)   BMI 34.64 kg/m       Objective:   Physical Exam  Constitutional: She appears well-developed and well-nourished.  Cardiovascular: Normal rate, regular rhythm and normal heart sounds.  No murmur heard. Pulmonary/Chest: Effort normal and breath sounds normal. No respiratory distress. She has no wheezes.  Musculoskeletal:  2+ RLE edema  Psychiatric: She has a normal mood and affect. Her behavior is normal. Judgment and thought content normal.          Assessment & Plan:  RLE swelling- New.  Will obtain RLE doppler to exclude dvt. If negative, is likely related to venous insufficiency.

## 2018-06-21 ENCOUNTER — Encounter: Payer: BLUE CROSS/BLUE SHIELD | Admitting: Family

## 2018-07-05 ENCOUNTER — Ambulatory Visit (INDEPENDENT_AMBULATORY_CARE_PROVIDER_SITE_OTHER): Payer: BLUE CROSS/BLUE SHIELD | Admitting: Family

## 2018-07-05 ENCOUNTER — Encounter: Payer: BLUE CROSS/BLUE SHIELD | Admitting: Family

## 2018-07-05 ENCOUNTER — Other Ambulatory Visit (HOSPITAL_COMMUNITY)
Admission: RE | Admit: 2018-07-05 | Discharge: 2018-07-05 | Disposition: A | Discharge status: Home / Self Care | Payer: BLUE CROSS/BLUE SHIELD | Source: Ambulatory Visit | Attending: Family | Admitting: Family

## 2018-07-05 ENCOUNTER — Encounter: Payer: Self-pay | Admitting: Family

## 2018-07-05 VITALS — BP 117/81 | HR 60 | Temp 98.0°F | Resp 16 | Ht 66.0 in | Wt 225.0 lb

## 2018-07-05 DIAGNOSIS — Z0000 Encounter for general adult medical examination without abnormal findings: Secondary | ICD-10-CM | POA: Diagnosis not present

## 2018-07-05 DIAGNOSIS — Z01419 Encounter for gynecological examination (general) (routine) without abnormal findings: Secondary | ICD-10-CM

## 2018-07-05 LAB — LIPID PANEL
Cholesterol: 134 mg/dL (ref 0–200)
HDL: 54.9 mg/dL (ref >39.00)
LDL Cholesterol: 61 mg/dL (ref 0–99)
NONHDL: 78.81
Total CHOL/HDL Ratio: 2
Triglycerides: 88 mg/dL (ref 0.0–149.0)
VLDL: 17.6 mg/dL (ref 0.0–40.0)

## 2018-07-05 LAB — URINALYSIS, ROUTINE W REFLEX MICROSCOPIC
Bilirubin Urine: NEGATIVE
HGB URINE DIPSTICK: NEGATIVE
Ketones, ur: NEGATIVE
LEUKOCYTE UA: NEGATIVE
Nitrite: NEGATIVE
Specific Gravity, Urine: 1.015 (ref 1.000–1.030)
TOTAL PROTEIN, URINE-UPE24: NEGATIVE
UROBILINOGEN UA: 0.2 (ref 0.0–1.0)
Urine Glucose: NEGATIVE
pH: 6.5 (ref 5.0–8.0)

## 2018-07-05 LAB — HEPATIC FUNCTION PANEL
ALBUMIN: 3.9 g/dL (ref 3.5–5.2)
ALK PHOS: 70 U/L (ref 39–117)
ALT: 17 U/L (ref 0–35)
AST: 19 U/L (ref 0–37)
BILIRUBIN DIRECT: 0.1 mg/dL (ref 0.0–0.3)
TOTAL PROTEIN: 6.6 g/dL (ref 6.0–8.3)
Total Bilirubin: 0.5 mg/dL (ref 0.2–1.2)

## 2018-07-05 LAB — CBC WITH DIFFERENTIAL/PLATELET
BASOS ABS: 0 10*3/uL (ref 0.0–0.1)
Basophils Relative: 0.5 % (ref 0.0–3.0)
EOS ABS: 0.2 10*3/uL (ref 0.0–0.7)
Eosinophils Relative: 3.1 % (ref 0.0–5.0)
HCT: 37.2 % (ref 36.0–46.0)
Hemoglobin: 12.1 g/dL (ref 12.0–15.0)
Lymphocytes Relative: 20.6 % (ref 12.0–46.0)
Lymphs Abs: 1.2 10*3/uL (ref 0.7–4.0)
MCHC: 32.5 g/dL (ref 30.0–36.0)
MCV: 80.5 fl (ref 78.0–100.0)
Monocytes Absolute: 0.5 10*3/uL (ref 0.1–1.0)
Monocytes Relative: 8.2 % (ref 3.0–12.0)
NEUTROS ABS: 3.9 10*3/uL (ref 1.4–7.7)
Neutrophils Relative %: 67.6 % (ref 43.0–77.0)
PLATELETS: 218 10*3/uL (ref 150.0–400.0)
RBC: 4.61 Mil/uL (ref 3.87–5.11)
RDW: 14.9 % (ref 11.5–15.5)
WBC: 5.8 10*3/uL (ref 4.0–10.5)

## 2018-07-05 LAB — BASIC METABOLIC PANEL
BUN: 10 mg/dL (ref 6–23)
CALCIUM: 9.2 mg/dL (ref 8.4–10.5)
CO2: 29 meq/L (ref 19–32)
CREATININE: 0.85 mg/dL (ref 0.40–1.20)
Chloride: 103 mEq/L (ref 96–112)
GFR: 89.65 mL/min (ref >60.00)
Glucose, Bld: 82 mg/dL (ref 70–99)
Potassium: 4.7 mEq/L (ref 3.5–5.1)
Sodium: 137 mEq/L (ref 135–145)

## 2018-07-05 NOTE — Progress Notes (Signed)
Subjective:    Patient ID: Nicole Bernard, female    DOB: April 22, 1981, 38 y.o.   MRN: [id number]  HPI  Patient presents today for complete physical.  Immunizations: declines flu shot, tetanus up to date Diet: trying to eat healthier Wt Readings from Last 3 Encounters:  07/05/18 225 lb (102.1 kg)  11/20/17 211 lb 6.4 oz (95.9 kg)  06/19/17 191 lb 6.4 oz (86.8 kg)  Exercise: not exercising Pap Smear: due Dental: up to date Vision: up to date   Review of Systems  Constitutional: Positive for unexpected weight change.  HENT: Negative for hearing loss and rhinorrhea.   Eyes: Negative for visual disturbance.  Respiratory: Negative for cough.   Cardiovascular: Positive for leg swelling.  Gastrointestinal: Negative for blood in stool, constipation and diarrhea.  Genitourinary: Positive for menstrual problem. Negative for dysuria, frequency and hematuria.  Musculoskeletal: Negative for arthralgias and myalgias.  Skin:       Denies skin rash  Neurological: Negative for headaches.  Hematological: Negative for adenopathy.  Psychiatric/Behavioral:       Denies depression/anxiety   Past Medical History:  Diagnosis Date  . Abnormal Pap smear of vagina   . Anemia    iron deficient  . History of miscarriage   . Onychomycosis of toenail   . Overweight(278.02)      Social History   Socioeconomic History  . Marital status: Married    Spouse name: Not on file  . Number of children: 2  . Years of education: Not on file  . Highest education level: Not on file  Occupational History  . Occupation: nurse    Comment: [employer]  Social Needs  . Financial resource strain: Not on file  . Food insecurity:    Worry: Not on file    Inability: Not on file  . Transportation needs:    Medical: Not on file    Non-medical: Not on file  Tobacco Use  . Smoking status: Never Smoker  . Smokeless tobacco: Never Used  Substance and Sexual Activity  . Alcohol use: No    Alcohol/week: 0.0  standard drinks  . Drug use: No  . Sexual activity: Yes    Partners: Male    Birth control/protection: Other-see comments    Comment: husband had vasectomy  Lifestyle  . Physical activity:    Days per week: Not on file    Minutes per session: Not on file  . Stress: Not on file  Relationships  . Social connections:    Talks on phone: Not on file    Gets together: Not on file    Attends religious service: Not on file    Active member of club or organization: Not on file    Attends meetings of clubs or organizations: Not on file    Relationship status: Not on file  . Intimate partner violence:    Fear of current or ex partner: Not on file    Emotionally abused: Not on file    Physically abused: Not on file    Forced sexual activity: Not on file  Other Topics Concern  . 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 CESAREAN SECTION    . GYNECOLOGIC CRYOSURGERY  2003  . UMBILICAL HERNIA REPAIR  05/04/2017    Family History  Problem Relation Age of Onset  . Heart disease Neg Hx   . Hypertension Neg Hx   . Diabetes Neg Hx   . Cancer Neg Hx   . Stroke Neg Hx     Allergies  Allergen Reactions  . Latex Swelling    Swelling of lips and face.    Current Outpatient Medications on File Prior to Visit  Medication Sig Dispense Refill  . Multiple Vitamins-Minerals (MULTIVITAMIN WITH MINERALS) tablet Take 1 tablet by mouth daily. Reported on 06/13/2015     No current facility-administered medications on file prior to visit.     BP 117/81 (BP Location: Left Arm, Patient Position: Sitting, Cuff Size: Large)   Pulse 60   Temp 98 F (36.7 C) (Oral)   Resp 16   Ht 5\' 6"  (1.676 m)   Wt 225 lb (102.1 kg)   LMP 06/07/2018   SpO2 100%   BMI  36.32 kg/m       Objective:   Physical Exam Physical Exam  Constitutional: She is oriented to person, place, and time. She appears well-developed and well-nourished. No distress.  HENT:  Head: Normocephalic and atraumatic.  Right Ear: Tympanic membrane and ear canal normal.  Left Ear: Tympanic membrane and ear canal normal.  Mouth/Throat: Oropharynx is clear and moist.  Eyes: Pupils are equal, round, and reactive to light. No scleral icterus.  Neck: Normal range of motion. No thyromegaly present.  Cardiovascular: Normal rate and regular rhythm.   No murmur heard. Pulmonary/Chest: Effort normal and breath sounds normal. No respiratory distress. He has no wheezes. She has no rales. She exhibits no tenderness.  Abdominal: Soft. Bowel sounds are normal. She exhibits no distension and no mass. There is no tenderness. There is no rebound and no guarding.  Musculoskeletal: She exhibits no edema.  Lymphadenopathy:    She has no cervical adenopathy.  Neurological: She is alert and oriented to person, place, and time. She has normal patellar reflexes. She exhibits normal muscle tone. Coordination normal.  Skin: Skin is warm and dry.  Psychiatric: She has a normal mood and affect. Her behavior is normal. Judgment and thought content normal.  Breasts: Examined lying Right: Without masses, retractions, discharge or axillary adenopathy.  Left: Without masses, retractions, discharge or axillary adenopathy.  Inguinal/mons: Normal without inguinal adenopathy  External genitalia: Normal  BUS/Urethra/Skene's glands: Normal  Bladder: Normal  Vagina: Normal  Cervix: Normal  Uterus: normal in size, shape and contour. Midline and mobile  Adnexa/parametria:  Rt: Without masses or tenderness.  Lt: Without masses or tenderness.  Anus and perineum: Normal            Assessment & Plan:  Preventative care- discussed healthy diet, exercise, weight loss. She has had significant weight gain this  year.  Obtain TSH as well as other routine labs. Pap performed with HPV.          Assessment & Plan:

## 2018-07-05 NOTE — Patient Instructions (Addendum)
Please work on healthy diet, exercise and weight loss.  Complete lab work prior to leaving. Schedule mammogram on the first floor.

## 2018-07-06 LAB — TSH: TSH: 0.69 u[IU]/mL (ref 0.35–4.50)

## 2018-07-07 ENCOUNTER — Telehealth: Payer: Self-pay | Admitting: Family

## 2018-07-07 LAB — CYTOLOGY - PAP
Diagnosis: NEGATIVE
HPV: NOT DETECTED

## 2018-07-07 MED ORDER — FLUCONAZOLE 150 MG PO TABS: 150.0000 mg | ORAL_TABLET | Freq: Once | ORAL | 0 refills | Status: AC

## 2018-07-07 NOTE — Telephone Encounter (Signed)
Pap smear is negative but shows yeast infection. Rx sent for diflucan.

## 2018-07-07 NOTE — Telephone Encounter (Signed)
Results given to patient, advised of rx.

## 2018-07-10 ENCOUNTER — Encounter: Payer: Self-pay | Admitting: Family

## 2018-07-27 ENCOUNTER — Inpatient Hospital Stay (HOSPITAL_BASED_OUTPATIENT_CLINIC_OR_DEPARTMENT_OTHER): Admission: RE | Admit: 2018-07-27 | Payer: BLUE CROSS/BLUE SHIELD | Source: Ambulatory Visit

## 2018-07-29 ENCOUNTER — Ambulatory Visit (HOSPITAL_BASED_OUTPATIENT_CLINIC_OR_DEPARTMENT_OTHER)
Admission: RE | Admit: 2018-07-29 | Discharge: 2018-07-29 | Disposition: A | Discharge status: Home / Self Care | Payer: BLUE CROSS/BLUE SHIELD | Source: Ambulatory Visit | Attending: Family | Admitting: Family

## 2018-07-29 ENCOUNTER — Other Ambulatory Visit: Payer: Self-pay

## 2018-07-29 DIAGNOSIS — Z1231 Encounter for screening mammogram for malignant neoplasm of breast: Secondary | ICD-10-CM | POA: Diagnosis not present

## 2018-07-29 DIAGNOSIS — Z0000 Encounter for general adult medical examination without abnormal findings: Secondary | ICD-10-CM | POA: Insufficient documentation

## 2018-08-02 ENCOUNTER — Other Ambulatory Visit: Payer: Self-pay | Admitting: Family

## 2018-08-02 DIAGNOSIS — R928 Other abnormal and inconclusive findings on diagnostic imaging of breast: Secondary | ICD-10-CM

## 2018-08-05 ENCOUNTER — Other Ambulatory Visit: Payer: BLUE CROSS/BLUE SHIELD

## 2018-08-06 ENCOUNTER — Other Ambulatory Visit: Payer: Self-pay

## 2018-08-06 ENCOUNTER — Other Ambulatory Visit: Payer: BLUE CROSS/BLUE SHIELD

## 2018-08-06 ENCOUNTER — Ambulatory Visit
Admission: RE | Admit: 2018-08-06 | Discharge: 2018-08-06 | Disposition: A | Discharge status: Home / Self Care | Payer: BLUE CROSS/BLUE SHIELD | Source: Ambulatory Visit | Attending: Family | Admitting: Family

## 2018-08-06 ENCOUNTER — Other Ambulatory Visit: Payer: Self-pay | Admitting: Family

## 2018-08-06 DIAGNOSIS — N6310 Unspecified lump in the right breast, unspecified quadrant: Secondary | ICD-10-CM

## 2018-08-06 DIAGNOSIS — R928 Other abnormal and inconclusive findings on diagnostic imaging of breast: Secondary | ICD-10-CM | POA: Diagnosis not present

## 2018-12-07 DIAGNOSIS — B342 Coronavirus infection, unspecified: Secondary | ICD-10-CM | POA: Diagnosis not present

## 2019-02-07 ENCOUNTER — Other Ambulatory Visit: Payer: BLUE CROSS/BLUE SHIELD

## 2019-02-16 ENCOUNTER — Other Ambulatory Visit: Payer: Self-pay | Admitting: Family

## 2019-02-16 ENCOUNTER — Ambulatory Visit
Admission: RE | Admit: 2019-02-16 | Discharge: 2019-02-16 | Disposition: A | Discharge status: Home / Self Care | Payer: BC Managed Care – PPO | Source: Ambulatory Visit | Attending: Family | Admitting: Family

## 2019-02-16 ENCOUNTER — Other Ambulatory Visit: Payer: Self-pay

## 2019-02-16 DIAGNOSIS — N6311 Unspecified lump in the right breast, upper outer quadrant: Secondary | ICD-10-CM | POA: Diagnosis not present

## 2019-02-16 DIAGNOSIS — N6310 Unspecified lump in the right breast, unspecified quadrant: Secondary | ICD-10-CM

## 2019-02-16 DIAGNOSIS — N6312 Unspecified lump in the right breast, upper inner quadrant: Secondary | ICD-10-CM | POA: Diagnosis not present

## 2019-07-08 ENCOUNTER — Other Ambulatory Visit: Payer: Self-pay

## 2019-07-08 ENCOUNTER — Ambulatory Visit (INDEPENDENT_AMBULATORY_CARE_PROVIDER_SITE_OTHER): Payer: BC Managed Care – PPO | Admitting: Family

## 2019-07-08 ENCOUNTER — Encounter: Payer: Self-pay | Admitting: Family

## 2019-07-08 VITALS — BP 115/63 | HR 64 | Temp 97.3°F | Resp 16 | Ht 65.0 in | Wt 231.0 lb

## 2019-07-08 DIAGNOSIS — Z0000 Encounter for general adult medical examination without abnormal findings: Secondary | ICD-10-CM | POA: Diagnosis not present

## 2019-07-08 LAB — LIPID PANEL
Cholesterol: 147 mg/dL (ref 0–200)
HDL: 59.8 mg/dL (ref >39.00)
LDL Cholesterol: 73 mg/dL (ref 0–99)
NonHDL: 87.52
Total CHOL/HDL Ratio: 2
Triglycerides: 75 mg/dL (ref 0.0–149.0)
VLDL: 15 mg/dL (ref 0.0–40.0)

## 2019-07-08 LAB — CBC WITH DIFFERENTIAL/PLATELET
Basophils Absolute: 0 10*3/uL (ref 0.0–0.1)
Basophils Relative: 0.5 % (ref 0.0–3.0)
Eosinophils Absolute: 0.2 10*3/uL (ref 0.0–0.7)
Eosinophils Relative: 3 % (ref 0.0–5.0)
HCT: 37.7 % (ref 36.0–46.0)
Hemoglobin: 12.1 g/dL (ref 12.0–15.0)
Lymphocytes Relative: 22.4 % (ref 12.0–46.0)
Lymphs Abs: 1.3 10*3/uL (ref 0.7–4.0)
MCHC: 32.2 g/dL (ref 30.0–36.0)
MCV: 80.6 fl (ref 78.0–100.0)
Monocytes Absolute: 0.3 10*3/uL (ref 0.1–1.0)
Monocytes Relative: 6 % (ref 3.0–12.0)
Neutro Abs: 3.9 10*3/uL (ref 1.4–7.7)
Neutrophils Relative %: 68.1 % (ref 43.0–77.0)
Platelets: 212 10*3/uL (ref 150.0–400.0)
RBC: 4.68 Mil/uL (ref 3.87–5.11)
RDW: 15.1 % (ref 11.5–15.5)
WBC: 5.7 10*3/uL (ref 4.0–10.5)

## 2019-07-08 LAB — BASIC METABOLIC PANEL
BUN: 9 mg/dL (ref 6–23)
CO2: 28 mEq/L (ref 19–32)
Calcium: 8.6 mg/dL (ref 8.4–10.5)
Chloride: 103 mEq/L (ref 96–112)
Creatinine, Ser: 0.82 mg/dL (ref 0.40–1.20)
GFR: 92.97 mL/min (ref >60.00)
Glucose, Bld: 98 mg/dL (ref 70–99)
Potassium: 3.8 mEq/L (ref 3.5–5.1)
Sodium: 137 mEq/L (ref 135–145)

## 2019-07-08 LAB — HEPATIC FUNCTION PANEL
ALT: 17 U/L (ref 0–35)
AST: 16 U/L (ref 0–37)
Albumin: 3.7 g/dL (ref 3.5–5.2)
Alkaline Phosphatase: 73 U/L (ref 39–117)
Bilirubin, Direct: 0 mg/dL (ref 0.0–0.3)
Total Bilirubin: 0.3 mg/dL (ref 0.2–1.2)
Total Protein: 6.9 g/dL (ref 6.0–8.3)

## 2019-07-08 LAB — TSH: TSH: 0.87 u[IU]/mL (ref 0.35–4.50)

## 2019-07-08 NOTE — Patient Instructions (Addendum)
Please complete lab work prior to leaving. Work on [employer], exercise and weight loss.    Preventive Care 41-41 Years Old, Female Preventive care refers to visits with your health care provider and lifestyle choices that can promote health and wellness. This includes:  A yearly physical exam. This may also be called an annual well check.  Regular dental visits and eye exams.  Immunizations.  Screening for certain conditions.  Healthy lifestyle choices, such as eating a healthy diet, getting regular exercise, not using drugs or products that contain nicotine and tobacco, and limiting alcohol use. What can I expect for my preventive care visit? Physical exam Your health care provider will check your:  Height and weight. This may be used to calculate body mass index (BMI), which tells if you are at a healthy weight.  Heart rate and blood pressure.  Skin for abnormal spots. Counseling Your health care provider may ask you questions about your:  Alcohol, tobacco, and drug use.  Emotional well-being.  Home and relationship well-being.  Sexual activity.  Eating habits.  Work and work Statistician.  Method of birth control.  Menstrual cycle.  Pregnancy history. What immunizations do I need?  Influenza (flu) vaccine  This is recommended every year. Tetanus, diphtheria, and pertussis (Tdap) vaccine  You may need a Td booster every 10 years. Varicella (chickenpox) vaccine  You may need this if you have not been vaccinated. Zoster (shingles) vaccine  You may need this after age 41. Measles, mumps, and rubella (MMR) vaccine  You may need at least one dose of MMR if you were born in 1957 or later. You may also need a second dose. Pneumococcal conjugate (PCV13) vaccine  You may need this if you have certain conditions and were not previously vaccinated. Pneumococcal polysaccharide (PPSV23) vaccine  You may need one or two doses if you smoke cigarettes or if you  have certain conditions. Meningococcal conjugate (MenACWY) vaccine  You may need this if you have certain conditions. Hepatitis A vaccine  You may need this if you have certain conditions or if you travel or work in places where you may be exposed to hepatitis A. Hepatitis B vaccine  You may need this if you have certain conditions or if you travel or work in places where you may be exposed to hepatitis B. Haemophilus influenzae type b (Hib) vaccine  You may need this if you have certain conditions. Human papillomavirus (HPV) vaccine  If recommended by your health care provider, you may need three doses over 6 months. You may receive vaccines as individual doses or as more than one vaccine together in one shot (combination vaccines). Talk with your health care provider about the risks and benefits of combination vaccines. What tests do I need? Blood tests  Lipid and cholesterol levels. These may be checked every 5 years, or more frequently if you are over 56 years old.  Hepatitis C test.  Hepatitis B test. Screening  Lung cancer screening. You may have this screening every year starting at age 41 if you have a 30-pack-year history of smoking and currently smoke or have quit within the past 15 years.  Colorectal cancer screening. All adults should have this screening starting at age 41 and continuing until age 52. Your health care provider may recommend screening at age 41 if you are at increased risk. You will have tests every 1-10 years, depending on your results and the type of screening test.  Diabetes screening. This is done by  checking your blood sugar (glucose) after you have not eaten for a while (fasting). You may have this done every 1-3 years.  Mammogram. This may be done every 1-2 years. Talk with your health care provider about when you should start having regular mammograms. This may depend on whether you have a family history of breast cancer.  BRCA-related cancer  screening. This may be done if you have a family history of breast, ovarian, tubal, or peritoneal cancers.  Pelvic exam and Pap test. This may be done every 3 years starting at age 41. Starting at age 41, this may be done every 5 years if you have a Pap test in combination with an HPV test. Other tests  Sexually transmitted disease (STD) testing.  Bone density scan. This is done to screen for osteoporosis. You may have this scan if you are at high risk for osteoporosis. Follow these instructions at home: Eating and drinking  Eat a diet that includes fresh fruits and vegetables, whole grains, lean protein, and low-fat dairy.  Take vitamin and mineral supplements as recommended by your health care provider.  Do not drink alcohol if: ? Your health care provider tells you not to drink. ? You are pregnant, may be pregnant, or are planning to become pregnant.  If you drink alcohol: ? Limit how much you have to 0-1 drink a day. ? Be aware of how much alcohol is in your drink. In the [place] one drink equals one 12 oz bottle of beer (355 mL), one 5 oz glass of wine (148 mL), or one 1 oz glass of hard liquor (44 mL). Lifestyle  Take daily care of your teeth and gums.  Stay active. Exercise for at least 30 minutes on 5 or more days each week.  Do not use any products that contain nicotine or tobacco, such as cigarettes, e-cigarettes, and chewing tobacco. If you need help quitting, ask your health care provider.  If you are sexually active, practice safe sex. Use a condom or other form of birth control (contraception) in order to prevent pregnancy and STIs (sexually transmitted infections).  If told by your health care provider, take low-dose aspirin daily starting at age 10. What's next?  Visit your health care provider once a year for a well check visit.  Ask your health care provider how often you should have your eyes and teeth checked.  Stay up to date on all vaccines. This  information is not intended to replace advice given to you by your health care provider. Make sure you discuss any questions you have with your health care provider. Document Revised: 01/14/2018 Document Reviewed: 01/14/2018 Elsevier Patient Education  2020 [employer].

## 2019-07-08 NOTE — Progress Notes (Signed)
Subjective:    Patient ID: Nicole Bernard, female    DOB: 03-13-1981, 39 y.o.   MRN: [id number]  HPI  Patient presents today for complete physical. Immunizations:  Tdap 2018 Diet:  Some fast food, skips meals.  Exercise: not exercising Wt Readings from Last 3 Encounters:  07/08/19 231 lb (104.8 kg)  07/05/18 225 lb (102.1 kg)  11/20/17 211 lb 6.4 oz (95.9 kg)  Pap Smear: 07/05/18 Mammogram: scheduled Dental:  Up to date Vision: up to date  Had covid back in December. Had fatigue, coughing, sob. Fully recovered.    Review of Systems  Constitutional: Positive for unexpected weight change.  HENT: Negative for rhinorrhea.   Respiratory: Negative for cough and shortness of breath.   Cardiovascular: Negative for chest pain.  Gastrointestinal: Negative for blood in stool, constipation and diarrhea.  Genitourinary: Negative for dysuria, frequency, hematuria and menstrual problem.  Musculoskeletal: Negative for arthralgias and myalgias.  Skin: Negative for rash.  Neurological: Negative for headaches.  Hematological: Negative for adenopathy.  Psychiatric/Behavioral:       Denies depression/anxiety   Past Medical History:  Diagnosis Date  . Abnormal Pap smear of vagina   . Anemia    iron deficient  . History of miscarriage   . Onychomycosis of toenail   . Overweight(278.02)      Social History   Socioeconomic History  . Marital status: Married    Spouse name: Not on file  . Number of children: 2  . Years of education: Not on file  . Highest education level: Not on file  Occupational History  . Occupation: nurse    Comment: [employer]  Tobacco Use  . Smoking status: Never Smoker  . Smokeless tobacco: Never Used  Substance and Sexual Activity  . Alcohol use: No    Alcohol/week: 0.0 standard drinks  . Drug use: No  . Sexual activity: Yes    Partners: Male    Birth control/protection: Other-see comments    Comment: husband had vasectomy  Other Topics  Concern  . Not on file  Social History Narrative   Regular exercise: no   Caffeine Use: no   Lives with husband and 2 children (twin girls 2006)   Works at [employer] (SNF),  She is an Corporate treasurer   Enjoys shopping                  Social Determinants of Radio broadcast assistant Strain:   . Difficulty of Paying Living Expenses: Not on file  Food Insecurity:   . Worried About Charity fundraiser in the Last Year: Not on file  . Ran Out of Food in the Last Year: Not on file  Transportation Needs:   . Lack of Transportation (Medical): Not on file  . Lack of Transportation (Non-Medical): Not on file  Physical Activity:   . Days of Exercise per Week: Not on file  . Minutes of Exercise per Session: Not on file  Stress:   . Feeling of Stress : Not on file  Social Connections:   . Frequency of Communication with Friends and Family: Not on file  . Frequency of Social Gatherings with Friends and Family: Not on file  . Attends Religious Services: Not on file  . Active Member of Clubs or Organizations: Not on file  . Attends Archivist Meetings: Not on file  . Marital Status: Not on file  Intimate Partner Violence:   . Fear of Current or Ex-Partner:  Not on file  . Emotionally Abused: Not on file  . Physically Abused: Not on file  . Sexually Abused: Not on file    Past Surgical History:  Procedure Laterality Date  . CESAREAN SECTION    . GYNECOLOGIC CRYOSURGERY  2003  . UMBILICAL HERNIA REPAIR  05/04/2017    Family History  Problem Relation Age of Onset  . Heart disease Neg Hx   . Hypertension Neg Hx   . Diabetes Neg Hx   . Cancer Neg Hx   . Stroke Neg Hx     Allergies  Allergen Reactions  . Latex Swelling    Swelling of lips and face.    No current outpatient medications on file prior to visit.   No current facility-administered medications on file prior to visit.    BP 115/63 (BP Location: Left Arm, Patient Position: Sitting, Cuff Size: Large)    Pulse 64   Temp (!) 97.3 F (36.3 C) (Temporal)   Resp 16   Ht 5\' 5"  (1.651 m)   Wt 231 lb (104.8 kg)   LMP 06/07/2019 (Approximate)   SpO2 100%   BMI 38.44 kg/m       Objective:   Physical Exam  Physical Exam  Constitutional: She is oriented to person, place, and time. She appears well-developed and well-nourished. No distress.  HENT:  Head: Normocephalic and atraumatic.  Right Ear: Tympanic membrane and ear canal normal.  Left Ear: Tympanic membrane and ear canal normal.  Mouth/Throat: Not examined- pt wearing mask for covid precautions Eyes: Pupils are equal, round, and reactive to light. No scleral icterus.  Neck: Normal range of motion. No thyromegaly present.  Cardiovascular: Normal rate and regular rhythm.   No murmur heard. Pulmonary/Chest: Effort normal and breath sounds normal. No respiratory distress. He has no wheezes. She has no rales. She exhibits no tenderness.  Abdominal: Soft. Bowel sounds are normal. She exhibits no distension and no mass. There is no tenderness. There is no rebound and no guarding.  Musculoskeletal: She exhibits trace lower extremity edema Lymphadenopathy:    She has no cervical adenopathy.  Neurological: She is alert and oriented to person, place, and time. She has 1+ right patellar reflex, 2 plus on left She exhibits normal muscle tone. Coordination normal.  Skin: Skin is warm and dry.  Psychiatric: She has a normal mood and affect. Her behavior is normal. Judgment and thought content normal.  Breasts: Examined lying Right: Without masses, retractions, discharge or axillary adenopathy.  Left: Without masses, retractions, discharge or axillary adenopathy.           Assessment & Plan:   Preventative care- discussed healthy diet, exercise and weight loss.  Declines flu shot.  Will obtain routine lab work. Pap up to date.  This visit occurred during the SARS-CoV-2 public health emergency.  Safety protocols were in place, including  screening questions prior to the visit, additional usage of staff PPE, and extensive cleaning of exam room while observing appropriate contact time as indicated for disinfecting solutions.       Assessment & Plan:

## 2019-08-25 ENCOUNTER — Ambulatory Visit
Admission: RE | Admit: 2019-08-25 | Discharge: 2019-08-25 | Disposition: A | Discharge status: Home / Self Care | Payer: BC Managed Care – PPO | Source: Ambulatory Visit | Attending: Family | Admitting: Family

## 2019-08-25 ENCOUNTER — Other Ambulatory Visit: Payer: Self-pay

## 2019-08-25 DIAGNOSIS — N6310 Unspecified lump in the right breast, unspecified quadrant: Secondary | ICD-10-CM

## 2020-07-13 ENCOUNTER — Encounter: Payer: BC Managed Care – PPO | Admitting: Family

## 2020-07-16 ENCOUNTER — Encounter: Payer: 59 | Admitting: Family

## 2020-07-23 ENCOUNTER — Other Ambulatory Visit: Payer: Self-pay

## 2020-07-23 ENCOUNTER — Ambulatory Visit (INDEPENDENT_AMBULATORY_CARE_PROVIDER_SITE_OTHER): Payer: No Typology Code available for payment source | Admitting: Family

## 2020-07-23 ENCOUNTER — Encounter: Payer: Self-pay | Admitting: Family

## 2020-07-23 VITALS — BP 119/79 | HR 68 | Temp 98.1°F | Resp 16 | Ht 65.0 in | Wt 241.0 lb

## 2020-07-23 DIAGNOSIS — Z862 Personal history of diseases of the blood and blood-forming organs and certain disorders involving the immune mechanism: Secondary | ICD-10-CM

## 2020-07-23 DIAGNOSIS — Z0000 Encounter for general adult medical examination without abnormal findings: Secondary | ICD-10-CM | POA: Diagnosis not present

## 2020-07-23 DIAGNOSIS — R635 Abnormal weight gain: Secondary | ICD-10-CM | POA: Diagnosis not present

## 2020-07-23 DIAGNOSIS — R928 Other abnormal and inconclusive findings on diagnostic imaging of breast: Secondary | ICD-10-CM

## 2020-07-23 LAB — COMPREHENSIVE METABOLIC PANEL
ALT: 13 U/L (ref 0–35)
AST: 13 U/L (ref 0–37)
Albumin: 3.7 g/dL (ref 3.5–5.2)
Alkaline Phosphatase: 64 U/L (ref 39–117)
BUN: 9 mg/dL (ref 6–23)
CO2: 29 mEq/L (ref 19–32)
Calcium: 8.9 mg/dL (ref 8.4–10.5)
Chloride: 104 mEq/L (ref 96–112)
Creatinine, Ser: 0.84 mg/dL (ref 0.40–1.20)
GFR: 85.95 mL/min (ref >60.00)
Glucose, Bld: 101 mg/dL — ABNORMAL HIGH (ref 70–99)
Potassium: 4.1 mEq/L (ref 3.5–5.1)
Sodium: 138 mEq/L (ref 135–145)
Total Bilirubin: 0.4 mg/dL (ref 0.2–1.2)
Total Protein: 6.4 g/dL (ref 6.0–8.3)

## 2020-07-23 LAB — CBC WITH DIFFERENTIAL/PLATELET
Basophils Absolute: 0 10*3/uL (ref 0.0–0.1)
Basophils Relative: 0.7 % (ref 0.0–3.0)
Eosinophils Absolute: 0.2 10*3/uL (ref 0.0–0.7)
Eosinophils Relative: 3.9 % (ref 0.0–5.0)
HCT: 35.7 % — ABNORMAL LOW (ref 36.0–46.0)
Hemoglobin: 11.9 g/dL — ABNORMAL LOW (ref 12.0–15.0)
Lymphocytes Relative: 23.9 % (ref 12.0–46.0)
Lymphs Abs: 1.3 10*3/uL (ref 0.7–4.0)
MCHC: 33.3 g/dL (ref 30.0–36.0)
MCV: 79.8 fl (ref 78.0–100.0)
Monocytes Absolute: 0.3 10*3/uL (ref 0.1–1.0)
Monocytes Relative: 6 % (ref 3.0–12.0)
Neutro Abs: 3.5 10*3/uL (ref 1.4–7.7)
Neutrophils Relative %: 65.5 % (ref 43.0–77.0)
Platelets: 220 10*3/uL (ref 150.0–400.0)
RBC: 4.47 Mil/uL (ref 3.87–5.11)
RDW: 14.8 % (ref 11.5–15.5)
WBC: 5.3 10*3/uL (ref 4.0–10.5)

## 2020-07-23 LAB — LIPID PANEL
Cholesterol: 167 mg/dL (ref 0–200)
HDL: 53.2 mg/dL (ref >39.00)
LDL Cholesterol: 95 mg/dL (ref 0–99)
NonHDL: 113.38
Total CHOL/HDL Ratio: 3
Triglycerides: 91 mg/dL (ref 0.0–149.0)
VLDL: 18.2 mg/dL (ref 0.0–40.0)

## 2020-07-23 LAB — TSH: TSH: 0.92 u[IU]/mL (ref 0.35–4.50)

## 2020-07-23 NOTE — Patient Instructions (Signed)
Please complete lab work prior to leaving.  Try to add 30 minutes of exercise 5 days a week.  

## 2020-07-23 NOTE — Addendum Note (Signed)
Addended by: [name] on: 07/23/2020 09:55 AM   Modules accepted: Orders

## 2020-07-23 NOTE — Progress Notes (Signed)
Subjective:    Patient ID: Nicole Bernard, female    DOB: Sep 19, 1980, 40 y.o.   MRN: [id number]  HPI  Patient is a 40 yr old female who presents today for cpx.  Immunizations:  2018 tetanus, declines flu shot.  Declines covid vaccine Diet:  Fast food, sometimes doesn't eat when Nicole Bernard is busy. Nicole Bernard drinks water or juice.  Wt Readings from Last 3 Encounters:  07/23/20 241 lb (109.3 kg)  07/08/19 231 lb (104.8 kg)  07/05/18 225 lb (102.1 kg)  Exercise: not exercising regularly, work schedule is irregular Pap Smear: 2020 Mammogram: due 4/8 Vision: up to date Dental:  Up to date     Review of Systems  Constitutional: Negative for unexpected weight change.  HENT: Negative for hearing loss and rhinorrhea.   Eyes: Negative for visual disturbance.  Respiratory: Negative for cough and shortness of breath.   Cardiovascular: Negative for chest pain.  Gastrointestinal: Negative for constipation and diarrhea.  Genitourinary: Negative for dysuria, frequency and menstrual problem.  Musculoskeletal: Negative for arthralgias and myalgias.  Skin: Negative for rash.  Neurological: Negative for headaches.  Hematological: Negative for adenopathy.  Psychiatric/Behavioral:       Denies depression/anxiety    Past Medical History:  Diagnosis Date  . Abnormal Pap smear of vagina   . Anemia    iron deficient  . History of miscarriage   . Onychomycosis of toenail   . Overweight(278.02)      Social History   Socioeconomic History  . Marital status: Married    Spouse name: Not on file  . Number of children: 2  . Years of education: Not on file  . Highest education level: Not on file  Occupational History  . Occupation: nurse    Comment: [employer]  Tobacco Use  . Smoking status: Never Smoker  . Smokeless tobacco: Never Used  Substance and Sexual Activity  . Alcohol use: No    Alcohol/week: 0.0 standard drinks  . Drug use: No  . Sexual activity: Yes    Partners: Male     Birth control/protection: Other-see comments    Comment: husband had vasectomy  Other Topics Concern  . 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 CESAREAN SECTION    . GYNECOLOGIC CRYOSURGERY  2003  . UMBILICAL HERNIA REPAIR  05/04/2017    Family History  Problem Relation Age of Onset  . Heart disease Neg Hx   . Hypertension Neg Hx   . Diabetes Neg Hx   . Cancer Neg Hx   . Stroke Neg Hx     Allergies  Allergen Reactions  . Latex Swelling    Swelling of lips and face.    No current outpatient medications on file prior to visit.   No current facility-administered medications on file prior to visit.    BP  119/79 (BP Location: Left Arm, Patient Position: Sitting, Cuff Size: Large)   Pulse 68   Temp 98.1 F (36.7 C) (Oral)   Resp 16   Ht 5\' 5"  (1.651 m)   Wt 241 lb (109.3 kg)   SpO2 98%   BMI 40.10 kg/m       Objective:   Physical Exam  Physical Exam  Constitutional: Nicole Bernard is oriented to person, place, and time. Nicole Bernard appears well-developed and well-nourished. No distress.  HENT:  Head: Normocephalic and atraumatic.  Right Ear: Tympanic membrane and ear canal normal.  Left Ear: Tympanic membrane and ear canal normal.  Mouth/Throat: Not examined- pt wearing mask Eyes: Pupils are equal, round, and reactive to light. No scleral icterus.  Neck: Normal range of motion. No thyromegaly present.  Cardiovascular: Normal rate and regular rhythm.   No murmur  heard. Pulmonary/Chest: Effort normal and breath sounds normal. No respiratory distress. He has no wheezes. Nicole Bernard has no rales. Nicole Bernard exhibits no tenderness.  Abdominal: Soft. Bowel sounds are normal. + Rectus Diastasis. There is no tenderness. There is no rebound and no guarding.  Musculoskeletal: Nicole Bernard exhibits no edema.  Lymphadenopathy:    Nicole Bernard has no cervical adenopathy.  Neurological: Nicole Bernard is alert and oriented to person, place, and time. Nicole Bernard has normal patellar reflexes. Nicole Bernard exhibits normal muscle tone. Coordination normal.  Skin: Skin is warm and dry.  Psychiatric: Nicole Bernard has a normal mood and affect. Her behavior is normal. Judgment and thought content normal.  Breast/pelvic: deferred         Assessment & Plan:   Preventative care- discussed healthy diet, exercise and weight loss. Ordered follow up diagnostic mammogram. Pt counseled on the importance of COVID-19 vaccination. Nicole Bernard declines.  Labs as ordered.    This visit occurred during the SARS-CoV-2 public health emergency.  Safety protocols were in place, including screening questions prior to the visit, additional usage of staff PPE, and extensive cleaning of exam room while observing appropriate contact time as indicated for disinfecting solutions.        Assessment & Plan:

## 2020-08-07 ENCOUNTER — Other Ambulatory Visit: Payer: Self-pay | Admitting: Family

## 2020-08-07 DIAGNOSIS — D249 Benign neoplasm of unspecified breast: Secondary | ICD-10-CM

## 2020-09-19 ENCOUNTER — Other Ambulatory Visit: Payer: No Typology Code available for payment source

## 2020-10-24 ENCOUNTER — Other Ambulatory Visit: Payer: No Typology Code available for payment source

## 2020-12-03 ENCOUNTER — Ambulatory Visit
Admission: RE | Admit: 2020-12-03 | Discharge: 2020-12-03 | Disposition: A | Discharge status: Home / Self Care | Payer: No Typology Code available for payment source | Source: Ambulatory Visit | Attending: Family | Admitting: Family

## 2020-12-03 ENCOUNTER — Other Ambulatory Visit (HOSPITAL_BASED_OUTPATIENT_CLINIC_OR_DEPARTMENT_OTHER): Payer: Self-pay | Admitting: Family

## 2020-12-03 ENCOUNTER — Other Ambulatory Visit: Payer: Self-pay

## 2020-12-03 DIAGNOSIS — D249 Benign neoplasm of unspecified breast: Secondary | ICD-10-CM

## 2020-12-03 DIAGNOSIS — Z1231 Encounter for screening mammogram for malignant neoplasm of breast: Secondary | ICD-10-CM

## 2020-12-07 ENCOUNTER — Other Ambulatory Visit: Payer: No Typology Code available for payment source

## 2020-12-18 ENCOUNTER — Other Ambulatory Visit (HOSPITAL_BASED_OUTPATIENT_CLINIC_OR_DEPARTMENT_OTHER): Payer: Self-pay | Admitting: Family

## 2020-12-18 ENCOUNTER — Ambulatory Visit (HOSPITAL_BASED_OUTPATIENT_CLINIC_OR_DEPARTMENT_OTHER)
Admission: RE | Admit: 2020-12-18 | Discharge: 2020-12-18 | Disposition: A | Discharge status: Home / Self Care | Payer: No Typology Code available for payment source | Source: Ambulatory Visit | Attending: Family | Admitting: Family

## 2020-12-18 DIAGNOSIS — Z1231 Encounter for screening mammogram for malignant neoplasm of breast: Secondary | ICD-10-CM

## 2021-02-20 ENCOUNTER — Telehealth (HOSPITAL_BASED_OUTPATIENT_CLINIC_OR_DEPARTMENT_OTHER): Payer: Self-pay

## 2021-07-24 ENCOUNTER — Other Ambulatory Visit (HOSPITAL_COMMUNITY)
Admission: RE | Admit: 2021-07-24 | Discharge: 2021-07-24 | Disposition: A | Discharge status: Home / Self Care | Payer: No Typology Code available for payment source | Source: Ambulatory Visit | Attending: Family | Admitting: Family

## 2021-07-24 ENCOUNTER — Encounter: Payer: Self-pay | Admitting: Family

## 2021-07-24 ENCOUNTER — Ambulatory Visit (INDEPENDENT_AMBULATORY_CARE_PROVIDER_SITE_OTHER): Payer: No Typology Code available for payment source | Admitting: Family

## 2021-07-24 VITALS — BP 103/62 | HR 69 | Temp 98.4°F | Resp 16 | Ht 65.5 in | Wt 239.0 lb

## 2021-07-24 DIAGNOSIS — D509 Iron deficiency anemia, unspecified: Secondary | ICD-10-CM | POA: Diagnosis not present

## 2021-07-24 DIAGNOSIS — Z1159 Encounter for screening for other viral diseases: Secondary | ICD-10-CM

## 2021-07-24 DIAGNOSIS — Z0000 Encounter for general adult medical examination without abnormal findings: Secondary | ICD-10-CM

## 2021-07-24 DIAGNOSIS — R0789 Other chest pain: Secondary | ICD-10-CM | POA: Diagnosis not present

## 2021-07-24 DIAGNOSIS — Z01419 Encounter for gynecological examination (general) (routine) without abnormal findings: Secondary | ICD-10-CM

## 2021-07-24 DIAGNOSIS — R739 Hyperglycemia, unspecified: Secondary | ICD-10-CM | POA: Diagnosis not present

## 2021-07-24 LAB — COMPREHENSIVE METABOLIC PANEL
ALT: 96 U/L — ABNORMAL HIGH (ref 0–35)
AST: 59 U/L — ABNORMAL HIGH (ref 0–37)
Albumin: 4 g/dL (ref 3.5–5.2)
Alkaline Phosphatase: 108 U/L (ref 39–117)
BUN: 15 mg/dL (ref 6–23)
CO2: 29 mEq/L (ref 19–32)
Calcium: 9.2 mg/dL (ref 8.4–10.5)
Chloride: 103 mEq/L (ref 96–112)
Creatinine, Ser: 0.95 mg/dL (ref 0.40–1.20)
GFR: 73.63 mL/min (ref >60.00)
Glucose, Bld: 95 mg/dL (ref 70–99)
Potassium: 4.2 mEq/L (ref 3.5–5.1)
Sodium: 137 mEq/L (ref 135–145)
Total Bilirubin: 0.5 mg/dL (ref 0.2–1.2)
Total Protein: 6.7 g/dL (ref 6.0–8.3)

## 2021-07-24 LAB — CBC WITH DIFFERENTIAL/PLATELET
Basophils Absolute: 0 10*3/uL (ref 0.0–0.1)
Basophils Relative: 0.9 % (ref 0.0–3.0)
Eosinophils Absolute: 0.2 10*3/uL (ref 0.0–0.7)
Eosinophils Relative: 2.8 % (ref 0.0–5.0)
HCT: 36.8 % (ref 36.0–46.0)
Hemoglobin: 12.1 g/dL (ref 12.0–15.0)
Lymphocytes Relative: 25.3 % (ref 12.0–46.0)
Lymphs Abs: 1.4 10*3/uL (ref 0.7–4.0)
MCHC: 32.8 g/dL (ref 30.0–36.0)
MCV: 80 fl (ref 78.0–100.0)
Monocytes Absolute: 0.4 10*3/uL (ref 0.1–1.0)
Monocytes Relative: 7.3 % (ref 3.0–12.0)
Neutro Abs: 3.5 10*3/uL (ref 1.4–7.7)
Neutrophils Relative %: 63.7 % (ref 43.0–77.0)
Platelets: 233 10*3/uL (ref 150.0–400.0)
RBC: 4.6 Mil/uL (ref 3.87–5.11)
RDW: 14.7 % (ref 11.5–15.5)
WBC: 5.5 10*3/uL (ref 4.0–10.5)

## 2021-07-24 LAB — HEMOGLOBIN A1C: Hgb A1c MFr Bld: 6 % (ref 4.6–6.5)

## 2021-07-24 LAB — IRON: Iron: 86 ug/dL (ref 42–145)

## 2021-07-24 MED ORDER — OMEPRAZOLE 40 MG PO CPDR: 40.0000 mg | DELAYED_RELEASE_CAPSULE | Freq: Every day | ORAL | 3 refills | Status: DC

## 2021-07-24 NOTE — Assessment & Plan Note (Addendum)
New.  I suspect gerd. It only happens in the middle of the night when she is laying flat. EKG tracing is personally reviewed.  EKG notes NSR.  No acute changes.  ?Will give trial of omeprazole. In the meantime, we discussed gerd dietary changes. She is advised to go to the ER if symptoms worsen. Follow up in 1 month for re-evaluation.  ?

## 2021-07-24 NOTE — Progress Notes (Deleted)
? ?Subjective:  ? ? ? Patient ID: Nicole Bernard, female    DOB: [birth year], 41 y.o.   MRN: [id number] ? ?Chief Complaint  ?Patient presents with  ? Annual Exam  ? ? ?HPI ?Patient is in today for *** ? ?Health Maintenance Due  ?Topic Date Due  ? Hepatitis C Screening  Never done  ? PAP SMEAR-Modifier  07/05/2021  ? ? ?Past Medical History:  ?Diagnosis Date  ? Abnormal Pap smear of vagina   ? Anemia   ? iron deficient  ? History of miscarriage   ? Onychomycosis of toenail   ? Overweight(278.02)   ? ? ?Past Surgical History:  ?Procedure Laterality Date  ? CESAREAN SECTION    ? GYNECOLOGIC CRYOSURGERY  2003  ? UMBILICAL HERNIA REPAIR  05/04/2017  ? ? ?Family History  ?Problem Relation Age of Onset  ? Heart disease Neg Hx   ? Hypertension Neg Hx   ? Diabetes Neg Hx   ? Cancer Neg Hx   ? Stroke Neg Hx   ? ? ?Social History  ? ?Socioeconomic History  ? Marital status: Married  ?  Spouse name: Not on file  ? Number of children: 2  ? Years of education: Not on file  ? Highest education level: Not on file  ?Occupational History  ? Occupation: nurse  ?  Comment: [employer]  ?Tobacco Use  ? Smoking status: Never  ? Smokeless tobacco: Never  ?Substance and Sexual Activity  ? Alcohol use: No  ?  Alcohol/week: 0.0 standard drinks  ? Drug use: No  ? Sexual activity: Yes  ?  Partners: Male  ?  Birth control/protection: Other-see comments  ?  Comment: husband had vasectomy  ?Other Topics Concern  ? Not on file  ?Social History Narrative  ? Regular exercise: no  ? Caffeine Use: no  ? Lives with husband and 2 children (twin girls 2006)  ? 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 Enjoys shopping  ?   ?   ?   ?   ?   ? ?Social Determinants of Health  ? ?Financial Resource Strain: Not on file  ?Food Insecurity: Not on file  ?Transportation Needs: Not on file  ?Physical Activity: Not on file  ?Stress: Not on file  ?Social Connections: Not on file  ?Intimate Partner Violence: Not on file  ? ? ?Outpatient Medications  Prior to Visit  ?Medication Sig Dispense Refill  ? Multiple Vitamin (MULTIVITAMIN) tablet Take 1 tablet by mouth daily.    ? ?No facility-administered medications prior to visit.  ? ? ?Allergies  ?Allergen Reactions  ? Latex Swelling  ?  Swelling of lips and face.  ? ? ?ROS ? ?   ?Objective:  ?  ?Physical Exam ? ?BP 103/62 (BP Location: Right Arm, Patient Position: Sitting, Cuff Size: Large)   Pulse 69   Temp 98.4 ?F (36.9 ?C) (Oral)   Resp 16   Ht 5' 5.5" (1.664 m)   Wt 239 lb (108.4 kg)   SpO2 98%   BMI 39.17 kg/m?  ?Wt Readings from Last 3 Encounters:  ?07/24/21 239 lb (108.4 kg)  ?07/23/20 241 lb (109.3 kg)  ?07/08/19 231 lb (104.8 kg)  ? ? ?   ?Assessment & Plan:  ? ?Problem List Items Addressed This Visit   ? ?  ? Unprioritized  ? Preventative health care - Primary  ?  Wt Readings from Last 3 Encounters:  ?07/24/21 239 lb (108.4  kg)  ?07/23/20 241 lb (109.3 kg)  ?07/08/19 231 lb (104.8 kg)  ?Continue healthy diet, increase exercise. Discussed weight loss. Pap today. Mammo scheduled. Declines flu shot and covid vaccine.  ?  ?  ? Atypical chest pain  ?  New.  I suspect gerd. It only happens in the middle of the night when she is laying flat. EKG tracing is personally reviewed.  EKG notes NSR.  No acute changes.  ?Will give trial of omeprazole. In the meantime, we discussed gerd dietary changes. She is advised to go to the ER if symptoms worsen. Follow up in 1 month for re-evaluation.  ?  ?  ? Relevant Medications  ? omeprazole (PRILOSEC) 40 MG capsule  ? Other Relevant Orders  ? EKG 12-Lead (Completed)  ? ANEMIA-IRON DEFICIENCY  ? Relevant Orders  ? CBC with Differential/Platelet  ? Iron  ? ?Other Visit Diagnoses   ? ? Need for hepatitis C screening test      ? Relevant Orders  ? Hepatitis C Antibody  ? Hyperglycemia      ? Relevant Orders  ? Comp Met (CMET)  ? Hemoglobin A1c  ? Encounter for routine gynecological examination with Papanicolaou smear of cervix      ? Relevant Orders  ? Cytology - PAP( [facility])  ? ?  ? ? ?I am having Nicole Bernard start on omeprazole. I am also having her maintain her multivitamin. ? ?Meds ordered this encounter  ?Medications  ? omeprazole (PRILOSEC) 40 MG capsule  ?  Sig: Take 1 capsule (40 mg total) by mouth daily.  ?  Dispense:  30 capsule  ?  Refill:  3  ?  Order Specific Question:   Supervising Provider  ?  Answer:   Penni Homans A [[id number]]  ? ? ?

## 2021-07-24 NOTE — Patient Instructions (Signed)
Start omeprazole once daily for acid. Avoid acid triggering foods. Go to ER if severe/worsening pain.  ?

## 2021-07-24 NOTE — Assessment & Plan Note (Addendum)
Wt Readings from Last 3 Encounters:  ?07/24/21 239 lb (108.4 kg)  ?07/23/20 241 lb (109.3 kg)  ?07/08/19 231 lb (104.8 kg)  ? ?Continue healthy diet, increase exercise. Discussed weight loss. Pap today. Mammo scheduled. Declines flu shot and covid vaccine.  ?

## 2021-07-24 NOTE — Progress Notes (Signed)
Subjective:   By signing my name below, I, Nicole Bernard, attest that this documentation has been prepared under the direction and in the presence of Nicole Alar, NP. 07/24/2021    Patient ID: Nicole Bernard, female    DOB: May 08, 1981, 41 y.o.   MRN: [id number]  Chief Complaint  Patient presents with   Annual Exam    HPI Patient is in today for a comprehensive physical exam.  Chest tightness- She complains of occasional chest tightness and burning sensation during the night for the past couple of months. Her most recent episode was Monday night 07/22/2021. These episodes typically wake her up at night. During these episode she feels like something is pressing her chest and burning. She denies having any tingling or chest pain.  Blood count- During her last lab work her blood count was slightly low. She is interested in checking her blood count during her lab work.  Blood sugar- During her last lab work her blood sugar was slightly elevated. She is interested in checking her blood sugar during her lab work. She has no known family medical history of diabetes.   She denies having any fever, new muscle pain, joint pain, new moles, congestion, sinus pain, sore throat, palpations, cough, SOB, wheezing, n/v/d, constipation, blood in stool, dysuria, frequency, hematuria, depression, anxiety, headaches at this time. Social history: She has no recent surgical procedures in the past year. She has no recent changes to her family medical history. She does not use alcohol. She does not use drugs. She does not use tobacco or vaping products. She has 1 female partner. She continues working for the same company but relocated to [place].  Immunizations: She does not have a flu vaccine this year and is not interested in receiving it. She does not have any Covid-19 vaccines. She is interested in hepatitis C screening during her lab visit.  Diet: She is trying to manage a healthy diet.   Exercise: She is not participating in regular exercise.  Wt Readings from Last 3 Encounters:  07/24/21 239 lb (108.4 kg)  07/23/20 241 lb (109.3 kg)  07/08/19 231 lb (104.8 kg)   Pap Smear: Last completed 07/05/2018. Results showed FUNGAL ORGANISMS PRESENT CONSISTENT WITH CANDIDA SPP, otherwise results are normal. Repeat in 3 years. She is completing one during this visit.  Mammogram: Last completed 0718/2022. Results showed benign right breast mass demonstrating 2 year stability. No further imaging follow-up required. Otherwise results are normal. Repeat in 1 year. She has another appointment scheduled during the summer.  Dental: She is UTD on dental care.  Vision: She is UTD on vision care.   Health Maintenance Due  Topic Date Due   Hepatitis C Screening  Never done   PAP SMEAR-Modifier  07/05/2021    Past Medical History:  Diagnosis Date   Abnormal Pap smear of vagina    Anemia    iron deficient   History of miscarriage    Onychomycosis of toenail    Overweight(278.02)     Past Surgical History:  Procedure Laterality Date   CESAREAN SECTION     GYNECOLOGIC CRYOSURGERY  3662   UMBILICAL HERNIA REPAIR  05/04/2017    Family History  Problem Relation Age of Onset   Heart disease Neg Hx    Hypertension Neg Hx    Diabetes Neg Hx    Cancer Neg Hx    Stroke Neg Hx     Social History   Socioeconomic History   Marital status: Married  Spouse name: Not on file   Number of children: 2   Years of education: Not on file   Highest education level: Not on file  Occupational History   Occupation: nurse    Comment: [employer]  Tobacco Use   Smoking status: Never   Smokeless tobacco: Never  Substance and Sexual Activity   Alcohol use: No    Alcohol/week: 0.0 standard drinks   Drug use: No   Sexual activity: Yes    Partners: Male    Birth control/protection: Other-see comments    Comment: husband had vasectomy  Other Topics Concern   Not on file  Social History  Narrative   Regular exercise: no   Caffeine Use: no   Lives with husband and 2 children (twin girls 2006)   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     No facility-administered medications prior to visit.    Allergies  Allergen Reactions   Latex Swelling    Swelling of lips and face.    Review of Systems  Constitutional:        (-)unexpected weight change (-)Adenopathy  HENT:  Negative for hearing loss.        (-)Rhinorrhea   Eyes:        (-)Visual disturbance  Respiratory:  Negative for cough.   Cardiovascular:  Negative for chest pain, palpitations and leg swelling.       (+)chest tightness  Gastrointestinal:  Negative for blood in stool, constipation, diarrhea, nausea and vomiting.  Genitourinary:  Negative for dysuria and frequency.  Musculoskeletal:  Negative for joint pain and myalgias.  Skin:  Negative for rash.  Neurological:  Negative for tingling and headaches.  Psychiatric/Behavioral:  Negative for depression. The patient is not nervous/anxious.       Objective:    Physical Exam Constitutional:      General: She is not in acute distress.    Appearance: Normal appearance. She is not ill-appearing.  HENT:     Head: Normocephalic and atraumatic.     Right Ear: Tympanic membrane, ear canal and external ear normal.     Left Ear: Tympanic membrane, ear canal and external ear normal.  Eyes:     Extraocular Movements: Extraocular movements intact.     Right eye: No nystagmus.     Left eye: No nystagmus.      Pupils: Pupils are equal, round, and reactive to light.  Neck:     Thyroid: No thyroid tenderness.  Cardiovascular:     Rate and Rhythm: Normal rate and regular rhythm.     Heart sounds: Normal heart sounds. No murmur heard.   No gallop.  Pulmonary:     Effort: Pulmonary effort is normal. No respiratory distress.     Breath sounds: Normal breath sounds. No wheezing or rales.  Chest:  Breasts:    Right: Normal. No mass.     Left: Normal. No mass.  Abdominal:  General: There is no distension.     Palpations: Abdomen is soft.     Tenderness: There is no abdominal tenderness. There is no guarding.  Genitourinary:    General: Normal vulva.     Exam position: Lithotomy position.     Labia:        Right: No rash.        Left: No rash.      Vagina: Normal. No vaginal discharge.     Cervix: Normal.     Uterus: Normal.      Adnexa: Right adnexa normal and left adnexa normal.  Musculoskeletal:        General: No swelling.     Comments: 5/5 strength in both upper and lower extremities  Lymphadenopathy:     Cervical: No cervical adenopathy.  Skin:    General: Skin is warm and dry.  Neurological:     Mental Status: She is alert and oriented to person, place, and time.     Deep Tendon Reflexes:     Reflex Scores:      Patellar reflexes are 2+ on the right side and 2+ on the left side. Psychiatric:        Judgment: Judgment normal.    BP 103/62 (BP Location: Right Arm, Patient Position: Sitting, Cuff Size: Large)    Pulse 69    Temp 98.4 F (36.9 C) (Oral)    Resp 16    Ht 5' 5.5" (1.664 m)    Wt 239 lb (108.4 kg)    SpO2 98%    BMI 39.17 kg/m  Wt Readings from Last 3 Encounters:  07/24/21 239 lb (108.4 kg)  07/23/20 241 lb (109.3 kg)  07/08/19 231 lb (104.8 kg)       Assessment & Plan:   Problem List Items Addressed This Visit       Unprioritized   Preventative health care - Primary    Wt Readings from Last 3 Encounters:  07/24/21 239 lb (108.4 kg)  07/23/20  241 lb (109.3 kg)  07/08/19 231 lb (104.8 kg)  Continue healthy diet, increase exercise. Discussed weight loss. Pap today. Mammo scheduled. Declines flu shot and covid vaccine.       Atypical chest pain    New.  I suspect gerd. It only happens in the middle of the night when she is laying flat. EKG tracing is personally reviewed.  EKG notes NSR.  No acute changes.  Will give trial of omeprazole. In the meantime, we discussed gerd dietary changes. She is advised to go to the ER if symptoms worsen. Follow up in 1 month for re-evaluation.       Relevant Medications   omeprazole (PRILOSEC) 40 MG capsule   ANEMIA-IRON DEFICIENCY   Relevant Orders   CBC with Differential/Platelet   Iron   Other Visit Diagnoses     Need for hepatitis C screening test       Relevant Orders   Hepatitis C Antibody   Hyperglycemia       Relevant Orders   Comp Met (CMET)   Hemoglobin A1c        Meds ordered this encounter  Medications   omeprazole (PRILOSEC) 40 MG capsule    Sig: Take 1 capsule (40 mg total) by mouth daily.    Dispense:  30 capsule    Refill:  3    Order Specific Question:   Supervising Provider    Answer:   Mosie Lukes [[id number]]    I, Rip Hawes  Rowe Pavy, NP, personally preformed the services described in this documentation.  All medical record entries made by the scribe were at my direction and in my presence.  I have reviewed the chart and discharge instructions (if applicable) and agree that the record reflects my personal performance and is accurate and complete. 07/24/2021   I,Nicole Bernard,acting as a scribe for Nance Pear, NP.,have documented all relevant documentation on the behalf of Nance Pear, NP,as directed by  Nance Pear, NP while in the presence of Nance Pear, NP.   Nance Pear, NP

## 2021-07-25 LAB — CYTOLOGY - PAP
Adequacy: ABSENT
Comment: NEGATIVE
Diagnosis: NEGATIVE
High risk HPV: NEGATIVE

## 2021-07-25 LAB — HEPATITIS C ANTIBODY
Hepatitis C Ab: NONREACTIVE
SIGNAL TO CUT-OFF: <0.02 (ref <1.00)

## 2021-07-26 ENCOUNTER — Telehealth: Payer: Self-pay | Admitting: Family

## 2021-07-26 DIAGNOSIS — R7989 Other specified abnormal findings of blood chemistry: Secondary | ICD-10-CM

## 2021-07-26 NOTE — Telephone Encounter (Signed)
Lvm for patient to call back about this message. Will need liver US order signed if she agrees to have and lab appointment scheduled for within one month. Lab orders will also need to be signed.  ?

## 2021-07-26 NOTE — Telephone Encounter (Signed)
Liver function is elevated.  I would like for her to complete an abdominal ultrasound and repeat lab work in 1 month.  ?

## 2021-07-29 NOTE — Telephone Encounter (Signed)
Advised patient of results and she will get labs at one month scheduled appointment 4/12. She will call radiology to set up [place] before or on appointment date.  ?

## 2021-07-30 ENCOUNTER — Ambulatory Visit (HOSPITAL_BASED_OUTPATIENT_CLINIC_OR_DEPARTMENT_OTHER): Payer: No Typology Code available for payment source

## 2021-07-30 ENCOUNTER — Ambulatory Visit (HOSPITAL_BASED_OUTPATIENT_CLINIC_OR_DEPARTMENT_OTHER): Admission: RE | Admit: 2021-07-30 | Payer: No Typology Code available for payment source | Source: Ambulatory Visit

## 2021-08-05 ENCOUNTER — Other Ambulatory Visit (HOSPITAL_BASED_OUTPATIENT_CLINIC_OR_DEPARTMENT_OTHER): Payer: No Typology Code available for payment source

## 2021-08-05 ENCOUNTER — Ambulatory Visit (HOSPITAL_BASED_OUTPATIENT_CLINIC_OR_DEPARTMENT_OTHER): Payer: No Typology Code available for payment source

## 2021-08-07 ENCOUNTER — Other Ambulatory Visit: Payer: Self-pay

## 2021-08-07 ENCOUNTER — Ambulatory Visit (HOSPITAL_BASED_OUTPATIENT_CLINIC_OR_DEPARTMENT_OTHER)
Admission: RE | Admit: 2021-08-07 | Discharge: 2021-08-07 | Disposition: A | Discharge status: Home / Self Care | Payer: No Typology Code available for payment source | Source: Ambulatory Visit | Attending: Family | Admitting: Family

## 2021-08-07 DIAGNOSIS — R7989 Other specified abnormal findings of blood chemistry: Secondary | ICD-10-CM | POA: Diagnosis present

## 2021-08-11 ENCOUNTER — Encounter: Payer: Self-pay | Admitting: Family

## 2021-08-11 DIAGNOSIS — K760 Fatty (change of) liver, not elsewhere classified: Secondary | ICD-10-CM | POA: Insufficient documentation

## 2021-08-11 DIAGNOSIS — K8020 Calculus of gallbladder without cholecystitis without obstruction: Secondary | ICD-10-CM | POA: Insufficient documentation

## 2021-08-15 IMAGING — MG DIGITAL DIAGNOSTIC BILAT W/ TOMO W/ CAD
8 series · 8 of 24 positions shown · non-contrast
Comparison: Previous exam(s).

CLINICAL DATA: 40-year-old female presenting for annual bilateral
mammogram and a 2 year follow-up of a probably benign right breast
mass.

EXAM:
DIGITAL DIAGNOSTIC BILATERAL MAMMOGRAM WITH TOMOSYNTHESIS AND CAD;
ULTRASOUND RIGHT BREAST LIMITED
TECHNIQUE: Bilateral digital diagnostic mammography and breast tomosynthesis
was performed. The images were evaluated with computer-aided
detection.; Targeted ultrasound examination of the right breast was
performed

[R MLO synth-2D]
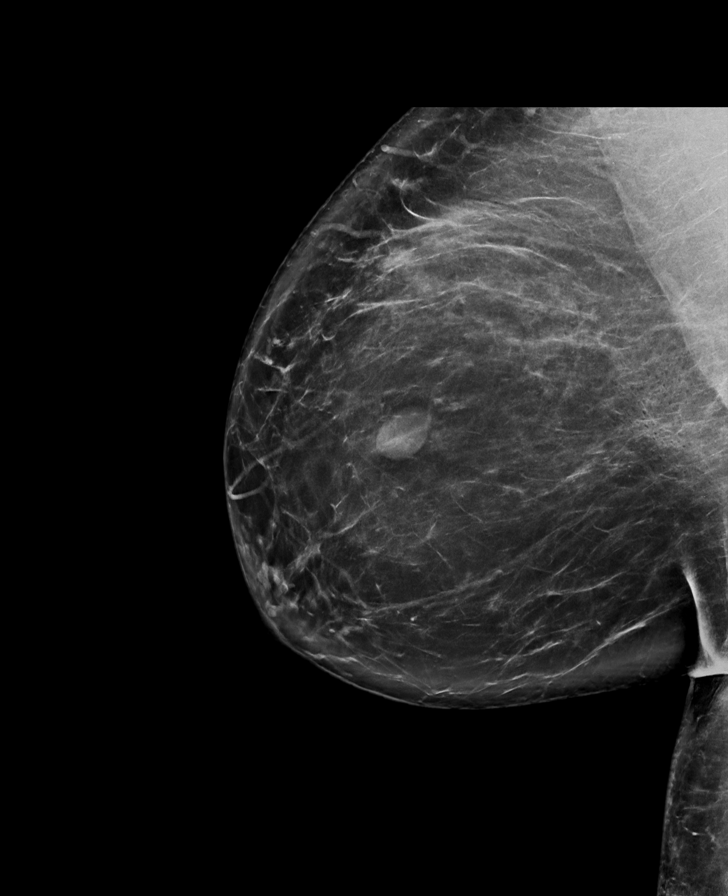

[L MLO synth-2D]
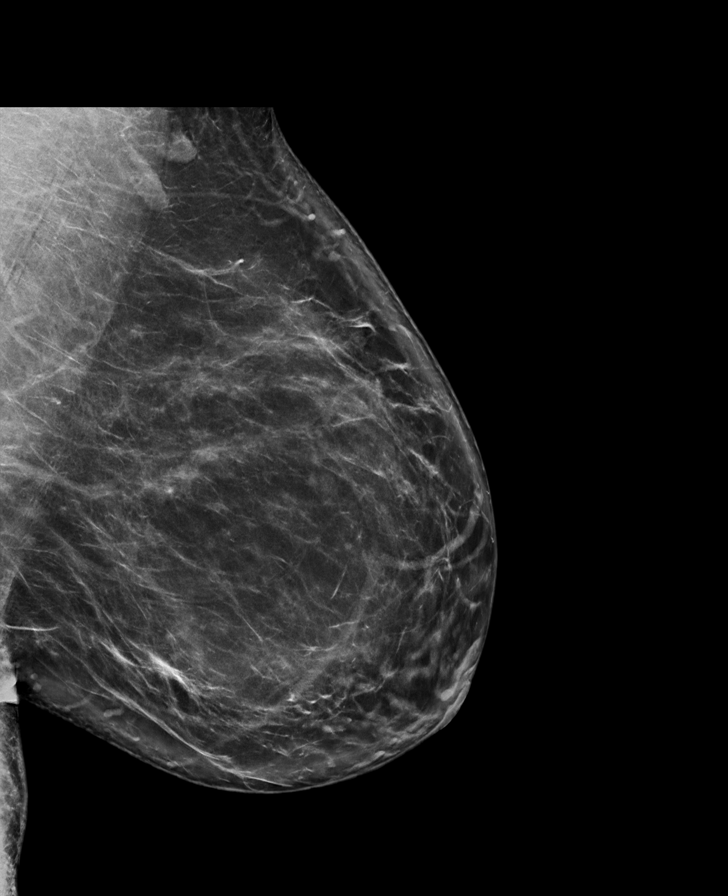

[R CC synth-2D]
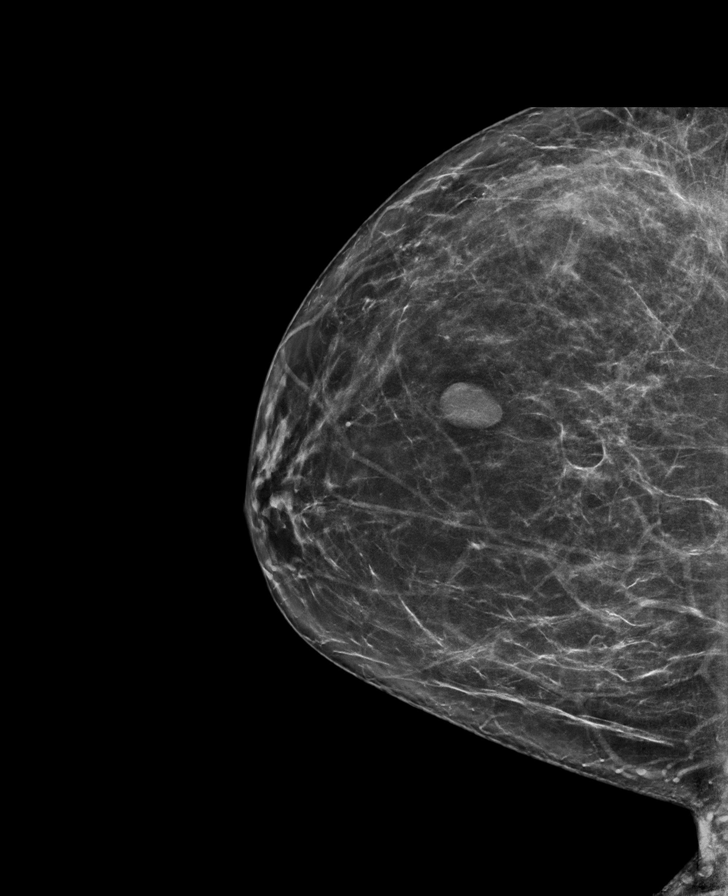

[L CC synth-2D]
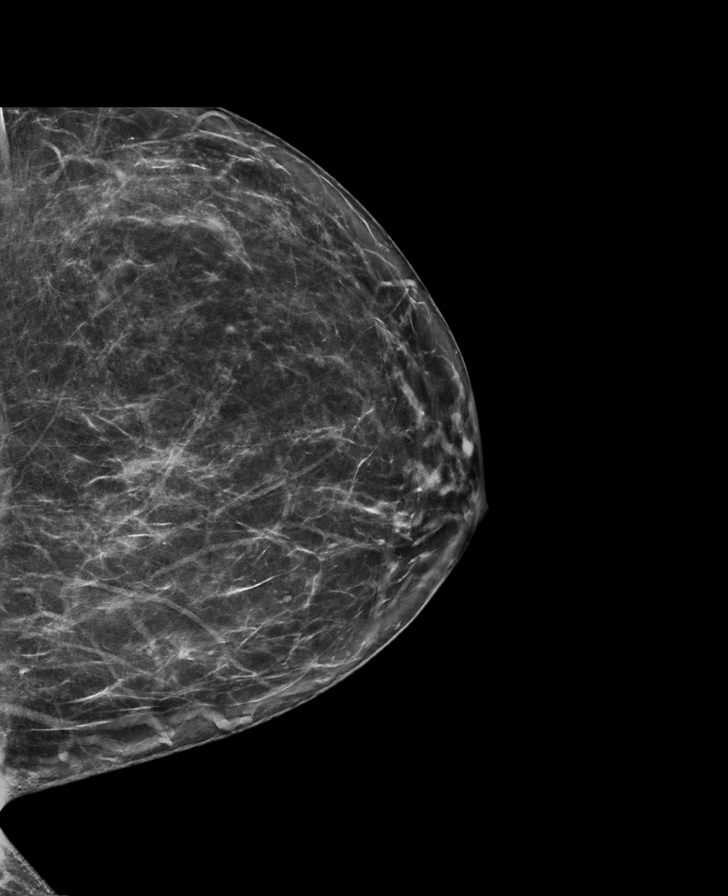

[R CC tomo · tomo slice 39/77.0]
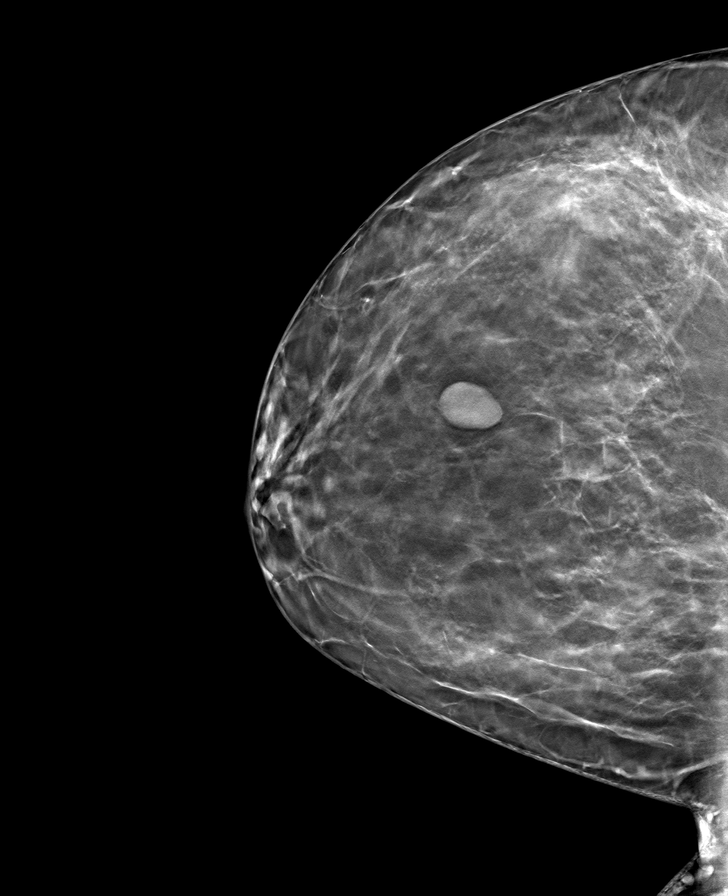

[L MLO tomo · tomo slice 49/97.0]
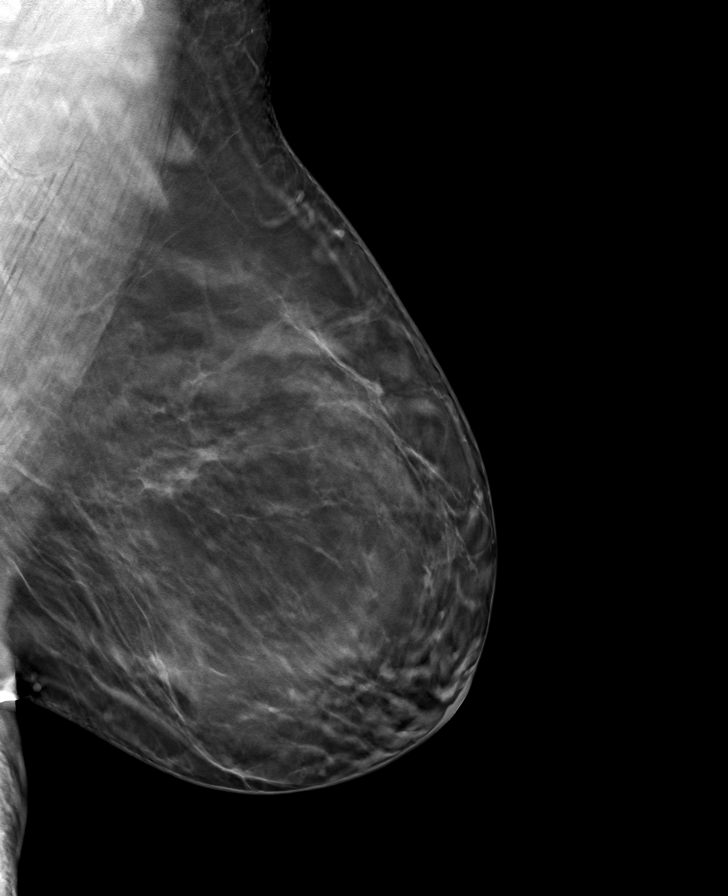

[R MLO tomo · tomo slice 51/100.0]
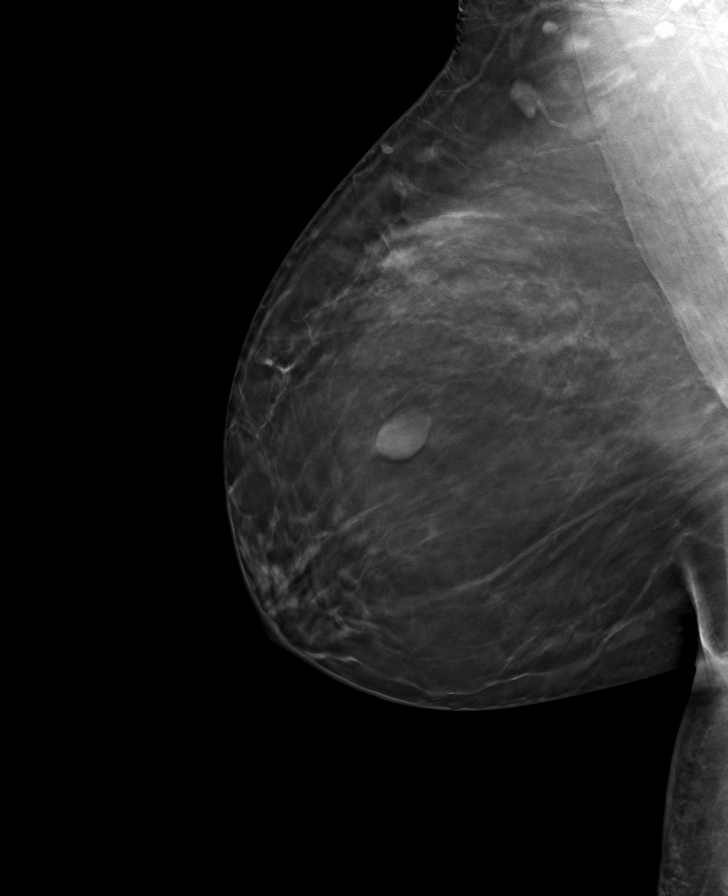

[L CC tomo · tomo slice 41/80.0]
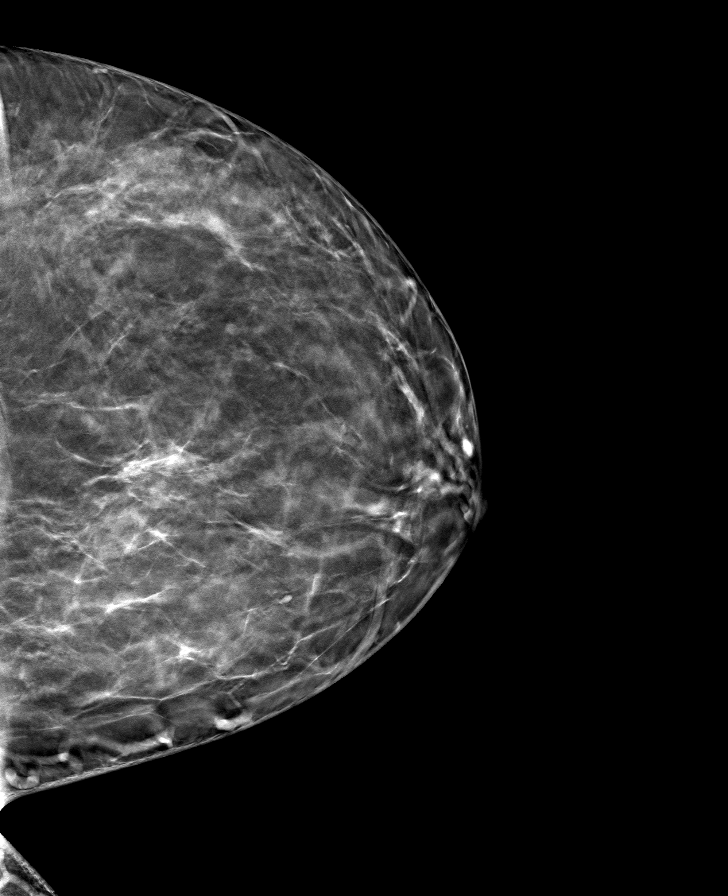

[8 of 24 positions shown; findings below may reference images not displayed]

ACR Breast Density Category b: There are scattered areas of
fibroglandular density.
FINDINGS: Stable appearance of an oval, circumscribed equal density mass in
the central right breast. Otherwise no new or suspicious findings.
The parenchymal pattern is stable.

Targeted ultrasound is performed, showing stable appearance of an
oval, circumscribed hypoechoic mass at the 12 o'clock position 3 cm
from the nipple. It measures 1.8 x 1.3 x 0.8 cm (previously 1.7 x
1.3 x 0.8 cm).
IMPRESSION: 1. No mammographic evidence of malignancy in either breast.
2. Benign right breast mass demonstrating 2 year stability. No
further imaging follow-up required.

RECOMMENDATION:
Screening mammogram in one year.(Code:BH-[id number])

I have discussed the findings and recommendations with the patient.
If applicable, a reminder letter will be sent to the patient
regarding the next appointment.

BI-RADS CATEGORY  2: Benign.

## 2021-08-28 ENCOUNTER — Ambulatory Visit: Payer: No Typology Code available for payment source | Admitting: Family

## 2021-09-04 ENCOUNTER — Ambulatory Visit: Payer: No Typology Code available for payment source | Admitting: Family

## 2021-09-11 ENCOUNTER — Ambulatory Visit: Payer: Self-pay | Admitting: Family

## 2021-09-11 ENCOUNTER — Ambulatory Visit: Payer: No Typology Code available for payment source | Admitting: Family

## 2021-09-11 NOTE — Progress Notes (Incomplete)
? ?Subjective:  ? ?By signing my name below, I, Nicole Bernard, attest that this documentation has been prepared under the direction and in the presence of [name], NP 09/11/2021   ?  ? ? Patient ID: Nicole Bernard, female    DOB: [birth year], 41 y.o.   MRN: [id number] ? ?No chief complaint on file. ? ? ?HPI ?Patient is in today for an office visit and 4 weeks follow-up ? ?Chest tightness- ? ?Past Medical History:  ?Diagnosis Date  ? Abnormal Pap smear of vagina   ? Anemia   ? iron deficient  ? Cholelithiasis   ? Fatty liver   ? History of miscarriage   ? Onychomycosis of toenail   ? Overweight(278.02)   ? ? ?Past Surgical History:  ?Procedure Laterality Date  ? CESAREAN SECTION    ? GYNECOLOGIC CRYOSURGERY  2003  ? UMBILICAL HERNIA REPAIR  05/04/2017  ? ? ?Family History  ?Problem Relation Age of Onset  ? Heart disease Neg Hx   ? Hypertension Neg Hx   ? Diabetes Neg Hx   ? Cancer Neg Hx   ? Stroke Neg Hx   ? ? ?Social History  ? ?Socioeconomic History  ? Marital status: Married  ?  Spouse name: Not on file  ? Number of children: 2  ? Years of education: Not on file  ? Highest education level: Not on file  ?Occupational History  ? Occupation: nurse  ?  Comment: [employer]  ?Tobacco Use  ? Smoking status: Never  ? Smokeless tobacco: Never  ?Substance and Sexual Activity  ? Alcohol use: No  ?  Alcohol/week: 0.0 standard drinks  ? Drug use: No  ? Sexual activity: Yes  ?  Partners: Male  ?  Birth control/protection: Other-see comments  ?  Comment: husband had vasectomy  ?Other Topics Concern  ? Not on file  ?Social History Narrative  ? Regular exercise: no  ? Caffeine Use: no  ? Lives with husband and 2 children (twin girls 2006)  ? 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 Enjoys shopping  ?   ?   ?   ?   ?   ? ?Social Determinants of Health  ? ?Financial Resource Strain: Not on file  ?Food Insecurity: Not on file  ?Transportation Needs: Not on file  ?Physical Activity: Not on file  ?Stress:  Not on file  ?Social Connections: Not on file  ?Intimate Partner Violence: Not on file  ? ? ?Outpatient Medications Prior to Visit  ?Medication Sig Dispense Refill  ? Multiple Vitamin (MULTIVITAMIN) tablet Take 1 tablet by mouth daily.    ? omeprazole (PRILOSEC) 40 MG capsule Take 1 capsule (40 mg total) by mouth daily. 30 capsule 3  ? ?No facility-administered medications prior to visit.  ? ? ?Allergies  ?Allergen Reactions  ? Latex Swelling  ?  Swelling of lips and face.  ? ? ?Review of Systems  ?Constitutional:  Negative for fever.  ?HENT:  Negative for ear pain and hearing loss.   ?     (-)nystagmus ?(-)adenopathy  ?Eyes:  Negative for blurred vision.  ?Respiratory:  Negative for cough, shortness of breath and wheezing.   ?Cardiovascular:  Negative for chest pain and leg swelling.  ?Gastrointestinal:  Negative for blood in stool, diarrhea, nausea and vomiting.  ?Genitourinary:  Negative for dysuria and frequency.  ?Musculoskeletal:  Negative for joint pain and myalgias.  ?Skin:  Negative for rash.  ?Neurological:  Negative for headaches.  ?Psychiatric/Behavioral:  Negative for depression. The patient is not nervous/anxious.   ? ?   ?Objective:  ?  ?Physical Exam ?Constitutional:   ?   General: She is not in acute distress. ?   Appearance: Normal appearance. She is not ill-appearing.  ?HENT:  ?   Head: Normocephalic and atraumatic.  ?   Right Ear: External ear normal.  ?   Left Ear: External ear normal.  ?Eyes:  ?   Extraocular Movements: Extraocular movements intact.  ?   Pupils: Pupils are equal, round, and reactive to light.  ?Cardiovascular:  ?   Rate and Rhythm: Normal rate and regular rhythm.  ?   Pulses: Normal pulses.  ?   Heart sounds: Normal heart sounds. No murmur heard. ?Pulmonary:  ?   Effort: Pulmonary effort is normal. No respiratory distress.  ?   Breath sounds: Normal breath sounds. No wheezing or rhonchi.  ?Abdominal:  ?   General: Bowel sounds are normal. There is no distension.  ?    Palpations: Abdomen is soft.  ?   Tenderness: There is no abdominal tenderness. There is no guarding or rebound.  ?Musculoskeletal:  ?   Cervical back: Neck supple.  ?Lymphadenopathy:  ?   Cervical: No cervical adenopathy.  ?Skin: ?   General: Skin is warm and dry.  ?Neurological:  ?   Mental Status: She is alert and oriented to person, place, and time.  ?Psychiatric:     ?   Behavior: Behavior normal.     ?   Judgment: Judgment normal.  ? ? ?There were no vitals taken for this visit. ?Wt Readings from Last 3 Encounters:  ?07/24/21 239 lb (108.4 kg)  ?07/23/20 241 lb (109.3 kg)  ?07/08/19 231 lb (104.8 kg)  ? ? ?Diabetic Foot Exam - Simple   ?No data filed ?  ? ?Lab Results  ?Component Value Date  ? WBC 5.5 07/24/2021  ? HGB 12.1 07/24/2021  ? HCT 36.8 07/24/2021  ? PLT 233.0 07/24/2021  ? GLUCOSE 95 07/24/2021  ? CHOL 167 07/23/2020  ? TRIG 91.0 07/23/2020  ? HDL 53.20 07/23/2020  ? [place] 95 07/23/2020  ? ALT 96 (H) 07/24/2021  ? AST 59 (H) 07/24/2021  ? NA 137 07/24/2021  ? K 4.2 07/24/2021  ? CL 103 07/24/2021  ? CREATININE 0.95 07/24/2021  ? BUN 15 07/24/2021  ? CO2 29 07/24/2021  ? TSH 0.92 07/23/2020  ? HGBA1C 6.0 07/24/2021  ? ? ?Lab Results  ?Component Value Date  ? TSH 0.92 07/23/2020  ? ?Lab Results  ?Component Value Date  ? WBC 5.5 07/24/2021  ? HGB 12.1 07/24/2021  ? HCT 36.8 07/24/2021  ? MCV 80.0 07/24/2021  ? PLT 233.0 07/24/2021  ? ?Lab Results  ?Component Value Date  ? NA 137 07/24/2021  ? K 4.2 07/24/2021  ? CO2 29 07/24/2021  ? GLUCOSE 95 07/24/2021  ? BUN 15 07/24/2021  ? CREATININE 0.95 07/24/2021  ? BILITOT 0.5 07/24/2021  ? ALKPHOS 108 07/24/2021  ? AST 59 (H) 07/24/2021  ? ALT 96 (H) 07/24/2021  ? PROT 6.7 07/24/2021  ? ALBUMIN 4.0 07/24/2021  ? CALCIUM 9.2 07/24/2021  ? GFR 73.63 07/24/2021  ? ?Lab Results  ?Component Value Date  ? CHOL 167 07/23/2020  ? ?Lab Results  ?Component Value Date  ? HDL 53.20 07/23/2020  ? ?Lab Results  ?Component Value Date  ? [place] 95 07/23/2020  ? ?Lab  Results  ?Component Value Date  ?  TRIG 91.0 07/23/2020  ? ?Lab Results  ?Component Value Date  ? CHOLHDL 3 07/23/2020  ? ?Lab Results  ?Component Value Date  ? HGBA1C 6.0 07/24/2021  ? ? ?   ?Assessment & Plan:  ? ?Problem List Items Addressed This Visit   ?None ? ? ?No orders of the defined types were placed in this encounter. ? ? ?I,Zite Okoli,acting as a Education administrator for [employer], NP.,have documented all relevant documentation on the behalf of Nance Pear, NP,as directed by  Nance Pear, NP while in the presence of Nance Pear, NP.  ? ?I, [name], NP , personally preformed the services described in this documentation.  All medical record entries made by the scribe were at my direction and in my presence.  I have reviewed the chart and discharge instructions (if applicable) and agree that the record reflects my personal performance and is accurate and complete. 09/11/2021 ?

## 2021-09-16 ENCOUNTER — Ambulatory Visit: Payer: Self-pay | Admitting: Family

## 2021-09-16 NOTE — Progress Notes (Incomplete)
? ?Subjective:  ? ?By signing my name below, I, [name], attest that this documentation has been prepared under the direction and in the presence of [name], NP 09/16/2021  ?   ? ? Patient ID: Nicole Bernard, female    DOB: [birth year], 41 y.o.   MRN: [id number] ? ?No chief complaint on file. ? ? ?HPI ?Patient is in today for an office visit and 4 week f/u ? ?Chest tightness-  ? ?Past Medical History:  ?Diagnosis Date  ? Abnormal Pap smear of vagina   ? Anemia   ? iron deficient  ? Cholelithiasis   ? Fatty liver   ? History of miscarriage   ? Onychomycosis of toenail   ? Overweight(278.02)   ? ? ?Past Surgical History:  ?Procedure Laterality Date  ? CESAREAN SECTION    ? GYNECOLOGIC CRYOSURGERY  2003  ? UMBILICAL HERNIA REPAIR  05/04/2017  ? ? ?Family History  ?Problem Relation Age of Onset  ? Heart disease Neg Hx   ? Hypertension Neg Hx   ? Diabetes Neg Hx   ? Cancer Neg Hx   ? Stroke Neg Hx   ? ? ?Social History  ? ?Socioeconomic History  ? Marital status: Married  ?  Spouse name: Not on file  ? Number of children: 2  ? Years of education: Not on file  ? Highest education level: Not on file  ?Occupational History  ? Occupation: nurse  ?  Comment: [employer]  ?Tobacco Use  ? Smoking status: Never  ? Smokeless tobacco: Never  ?Substance and Sexual Activity  ? Alcohol use: No  ?  Alcohol/week: 0.0 standard drinks  ? Drug use: No  ? Sexual activity: Yes  ?  Partners: Male  ?  Birth control/protection: Other-see comments  ?  Comment: husband had vasectomy  ?Other Topics Concern  ? Not on file  ?Social History Narrative  ? Regular exercise: no  ? Caffeine Use: no  ? Lives with husband and 2 children (twin girls 2006)  ? 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 Enjoys shopping  ?   ?   ?   ?   ?   ? ?Social Determinants of Health  ? ?Financial Resource Strain: Not on file  ?Food Insecurity: Not on file  ?Transportation Needs: Not on file  ?Physical Activity: Not on file  ?Stress: Not  on file  ?Social Connections: Not on file  ?Intimate Partner Violence: Not on file  ? ? ?Outpatient Medications Prior to Visit  ?Medication Sig Dispense Refill  ? Multiple Vitamin (MULTIVITAMIN) tablet Take 1 tablet by mouth daily.    ? omeprazole (PRILOSEC) 40 MG capsule Take 1 capsule (40 mg total) by mouth daily. 30 capsule 3  ? ?No facility-administered medications prior to visit.  ? ? ?Allergies  ?Allergen Reactions  ? Latex Swelling  ?  Swelling of lips and face.  ? ? ?ROS ? ?   ?Objective:  ?  ?Physical Exam ? ?There were no vitals taken for this visit. ?Wt Readings from Last 3 Encounters:  ?07/24/21 239 lb (108.4 kg)  ?07/23/20 241 lb (109.3 kg)  ?07/08/19 231 lb (104.8 kg)  ? ? ?Diabetic Foot Exam - Simple   ?No data filed ?  ? ?Lab Results  ?Component Value Date  ? WBC 5.5 07/24/2021  ? HGB 12.1 07/24/2021  ? HCT 36.8 07/24/2021  ? PLT 233.0 07/24/2021  ? GLUCOSE 95 07/24/2021  ? CHOL  167 07/23/2020  ? TRIG 91.0 07/23/2020  ? HDL 53.20 07/23/2020  ? [place] 95 07/23/2020  ? ALT 96 (H) 07/24/2021  ? AST 59 (H) 07/24/2021  ? NA 137 07/24/2021  ? K 4.2 07/24/2021  ? CL 103 07/24/2021  ? CREATININE 0.95 07/24/2021  ? BUN 15 07/24/2021  ? CO2 29 07/24/2021  ? TSH 0.92 07/23/2020  ? HGBA1C 6.0 07/24/2021  ? ? ?Lab Results  ?Component Value Date  ? TSH 0.92 07/23/2020  ? ?Lab Results  ?Component Value Date  ? WBC 5.5 07/24/2021  ? HGB 12.1 07/24/2021  ? HCT 36.8 07/24/2021  ? MCV 80.0 07/24/2021  ? PLT 233.0 07/24/2021  ? ?Lab Results  ?Component Value Date  ? NA 137 07/24/2021  ? K 4.2 07/24/2021  ? CO2 29 07/24/2021  ? GLUCOSE 95 07/24/2021  ? BUN 15 07/24/2021  ? CREATININE 0.95 07/24/2021  ? BILITOT 0.5 07/24/2021  ? ALKPHOS 108 07/24/2021  ? AST 59 (H) 07/24/2021  ? ALT 96 (H) 07/24/2021  ? PROT 6.7 07/24/2021  ? ALBUMIN 4.0 07/24/2021  ? CALCIUM 9.2 07/24/2021  ? GFR 73.63 07/24/2021  ? ?Lab Results  ?Component Value Date  ? CHOL 167 07/23/2020  ? ?Lab Results  ?Component Value Date  ? HDL 53.20 07/23/2020   ? ?Lab Results  ?Component Value Date  ? [place] 95 07/23/2020  ? ?Lab Results  ?Component Value Date  ? TRIG 91.0 07/23/2020  ? ?Lab Results  ?Component Value Date  ? CHOLHDL 3 07/23/2020  ? ?Lab Results  ?Component Value Date  ? HGBA1C 6.0 07/24/2021  ? ? ?   ?Assessment & Plan:  ? ?Problem List Items Addressed This Visit   ?None ? ? ? ?No orders of the defined types were placed in this encounter. ? ? ?I,Zite Okoli,acting as a Education administrator for [employer], NP.,have documented all relevant documentation on the behalf of Nance Pear, NP,as directed by  Nance Pear, NP while in the presence of Nance Pear, NP.  ? ?I, [name], NP, personally preformed the services described in this documentation.  All medical record entries made by the scribe were at my direction and in my presence.  I have reviewed the chart and discharge instructions (if applicable) and agree that the record reflects my personal performance and is accurate and complete. 09/16/2021 ?

## 2021-09-17 ENCOUNTER — Ambulatory Visit: Payer: Self-pay | Admitting: Family

## 2021-09-17 ENCOUNTER — Ambulatory Visit: Payer: BC Managed Care – PPO | Admitting: Family

## 2021-09-17 VITALS — BP 117/77 | HR 70 | Temp 98.7°F | Resp 16 | Wt 242.0 lb

## 2021-09-17 DIAGNOSIS — R739 Hyperglycemia, unspecified: Secondary | ICD-10-CM | POA: Diagnosis not present

## 2021-09-17 DIAGNOSIS — K760 Fatty (change of) liver, not elsewhere classified: Secondary | ICD-10-CM

## 2021-09-17 DIAGNOSIS — R0789 Other chest pain: Secondary | ICD-10-CM | POA: Diagnosis not present

## 2021-09-17 NOTE — Assessment & Plan Note (Signed)
Lab Results  ?Component Value Date  ? HGBA1C 6.0 07/24/2021  ? ?Discussed finding of borderline diabetes with the patient. Encouraged diet/exercise/weight loss goal.  ? ?

## 2021-09-17 NOTE — Progress Notes (Signed)
? ?Subjective:  ? ?By signing my name below, I, Carylon Perches, attest that this documentation has been prepared under the direction and in the presence of [name] NP, 09/17/2021   ? ? Patient ID: Nicole Bernard, female    DOB: Mar 25, 1981, 41 y.o.   MRN: [id number] ? ?Chief Complaint  ?Patient presents with  ? Gastroesophageal Reflux  ?  Here for follow up  ? ? ?HPI ?Patient is in today for an office visit. ? ?Blood Sugars - Her blood sugar is borderline. She reports that none of her family members has diabetes.  ?Lab Results  ?Component Value Date  ? HGBA1C 6.0 07/24/2021  ? ?Burning Sensation In Chest When Laying In Bed - She reports that she still has episodes of burning sensation in her chest when laying in bed. She takes 40 MG Omeprazole and has been using it occasionally but not consistently. ? ?There are no preventive care reminders to display for this patient. ? ?Past Medical History:  ?Diagnosis Date  ? Abnormal Pap smear of vagina   ? Anemia   ? iron deficient  ? Cholelithiasis   ? Fatty liver   ? History of miscarriage   ? Onychomycosis of toenail   ? Overweight(278.02)   ? ? ?Past Surgical History:  ?Procedure Laterality Date  ? CESAREAN SECTION    ? GYNECOLOGIC CRYOSURGERY  2003  ? UMBILICAL HERNIA REPAIR  05/04/2017  ? ? ?Family History  ?Problem Relation Age of Onset  ? Heart disease Neg Hx   ? Hypertension Neg Hx   ? Diabetes Neg Hx   ? Cancer Neg Hx   ? Stroke Neg Hx   ? ? ?Social History  ? ?Socioeconomic History  ? Marital status: Married  ?  Spouse name: Not on file  ? Number of children: 2  ? Years of education: Not on file  ? Highest education level: Not on file  ?Occupational History  ? Occupation: nurse  ?  Comment: [employer]  ?Tobacco Use  ? Smoking status: Never  ? Smokeless tobacco: Never  ?Substance and Sexual Activity  ? Alcohol use: No  ?  Alcohol/week: 0.0 standard drinks  ? Drug use: No  ? Sexual activity: Yes  ?  Partners: Male  ?  Birth control/protection:  Other-see comments  ?  Comment: husband had vasectomy  ?Other Topics Concern  ? Not on file  ?Social History Narrative  ? Regular exercise: no  ? Caffeine Use: no  ? Lives with husband and 2 children (twin girls 2006)  ? 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 Enjoys shopping  ?   ?   ?   ?   ?   ? ?Social Determinants of Health  ? ?Financial Resource Strain: Not on file  ?Food Insecurity: Not on file  ?Transportation Needs: Not on file  ?Physical Activity: Not on file  ?Stress: Not on file  ?Social Connections: Not on file  ?Intimate Partner Violence: Not on file  ? ? ?Outpatient Medications Prior to Visit  ?Medication Sig Dispense Refill  ? Multiple Vitamin (MULTIVITAMIN) tablet Take 1 tablet by mouth daily.    ? omeprazole (PRILOSEC) 40 MG capsule Take 1 capsule (40 mg total) by mouth daily. 30 capsule 3  ? ?No facility-administered medications prior to visit.  ? ? ?Allergies  ?Allergen Reactions  ? Latex Swelling  ?  Swelling of lips and face.  ? ? ?Review of Systems  ?Gastrointestinal:  Negative for abdominal pain.  ? ?   ?Objective:  ?  ?Physical Exam ?Constitutional:   ?   General: She is not in acute distress. ?   Appearance: Normal appearance. She is not ill-appearing.  ?HENT:  ?   Head: Normocephalic and atraumatic.  ?   Right Ear: External ear normal.  ?   Left Ear: External ear normal.  ?Eyes:  ?   Extraocular Movements: Extraocular movements intact.  ?   Pupils: Pupils are equal, round, and reactive to light.  ?Cardiovascular:  ?   Rate and Rhythm: Normal rate and regular rhythm.  ?   Heart sounds: Normal heart sounds. No murmur heard. ?  No gallop.  ?Pulmonary:  ?   Effort: Pulmonary effort is normal. No respiratory distress.  ?   Breath sounds: Normal breath sounds. No wheezing or rales.  ?Skin: ?   General: Skin is warm and dry.  ?Neurological:  ?   Mental Status: She is alert and oriented to person, place, and time.  ?Psychiatric:     ?   Mood and Affect: Mood normal.     ?   Behavior:  Behavior normal.     ?   Judgment: Judgment normal.  ? ? ?BP 117/77 (BP Location: Right Arm, Patient Position: Sitting, Cuff Size: Large)   Pulse 70   Temp 98.7 ?F (37.1 ?C) (Oral)   Resp 16   Wt 242 lb (109.8 kg)   SpO2 100%   BMI 39.66 kg/m?  ?Wt Readings from Last 3 Encounters:  ?09/17/21 242 lb (109.8 kg)  ?07/24/21 239 lb (108.4 kg)  ?07/23/20 241 lb (109.3 kg)  ? ? ?   ?Assessment & Plan:  ? ?Problem List Items Addressed This Visit   ? ?  ? Unprioritized  ? Hyperglycemia - Primary  ?  Lab Results  ?Component Value Date  ? HGBA1C 6.0 07/24/2021  ?Discussed finding of borderline diabetes with the patient. Encouraged diet/exercise/weight loss goal.  ? ?  ?  ? Fatty liver  ?  Discussed importance of low fat/low cholesterol diet/exercise and weight loss.  ? ?  ?  ? Atypical chest pain  ?  Occasional gerd symptoms only occurring when laying flat at night. I encouraged her to take omeprazole nightly for prevention.  ? ?  ?  ? ? ? ? ?No orders of the defined types were placed in this encounter. ? ? ?I, Nance Pear, NP, personally preformed the services described in this documentation.  All medical record entries made by the scribe were at my direction and in my presence.  I have reviewed the chart and discharge instructions (if applicable) and agree that the record reflects my personal performance and is accurate and complete. 09/17/2021 ? ? ?I,Amber Collins,acting as a Education administrator for [employer], NP.,have documented all relevant documentation on the behalf of Nance Pear, NP,as directed by  Nance Pear, NP while in the presence of Nance Pear, NP. ? ? ? ?Nance Pear, NP ? ?

## 2021-09-17 NOTE — Assessment & Plan Note (Signed)
Occasional gerd symptoms only occurring when laying flat at night. I encouraged her to take omeprazole nightly for prevention.  ?

## 2021-09-17 NOTE — Assessment & Plan Note (Signed)
Discussed importance of low fat/low cholesterol diet/exercise and weight loss.  ?

## 2021-09-17 NOTE — Patient Instructions (Signed)
Please work on healthy reduced carb/low fat diet, exercise and weight loss.  ? ?

## 2021-11-28 ENCOUNTER — Other Ambulatory Visit: Payer: Self-pay | Admitting: Family

## 2021-11-28 DIAGNOSIS — R0789 Other chest pain: Secondary | ICD-10-CM

## 2021-12-09 ENCOUNTER — Encounter: Payer: Self-pay | Admitting: Family

## 2021-12-09 NOTE — Telephone Encounter (Signed)
Error

## 2021-12-17 ENCOUNTER — Inpatient Hospital Stay (HOSPITAL_BASED_OUTPATIENT_CLINIC_OR_DEPARTMENT_OTHER): Admission: RE | Admit: 2021-12-17 | Payer: No Typology Code available for payment source | Source: Ambulatory Visit

## 2021-12-17 ENCOUNTER — Ambulatory Visit (HOSPITAL_BASED_OUTPATIENT_CLINIC_OR_DEPARTMENT_OTHER): Payer: Self-pay

## 2021-12-18 ENCOUNTER — Ambulatory Visit (HOSPITAL_BASED_OUTPATIENT_CLINIC_OR_DEPARTMENT_OTHER): Payer: Self-pay

## 2021-12-18 ENCOUNTER — Telehealth: Payer: Self-pay | Admitting: Family

## 2021-12-18 DIAGNOSIS — Z0000 Encounter for general adult medical examination without abnormal findings: Secondary | ICD-10-CM

## 2021-12-18 NOTE — Telephone Encounter (Signed)
Called but no answer Lvm for her to be aware order was entered

## 2021-12-18 NOTE — Telephone Encounter (Signed)
Order has been placed. Please notify pt.

## 2021-12-18 NOTE — Telephone Encounter (Signed)
Patient had to reschedule her mmg and the order expired today. Patient requesting new order to be sent and a call to let her know when complete so she can reschedule the appt.

## 2021-12-23 ENCOUNTER — Inpatient Hospital Stay (HOSPITAL_BASED_OUTPATIENT_CLINIC_OR_DEPARTMENT_OTHER): Admission: RE | Admit: 2021-12-23 | Payer: BC Managed Care – PPO | Source: Ambulatory Visit

## 2021-12-25 ENCOUNTER — Ambulatory Visit (HOSPITAL_BASED_OUTPATIENT_CLINIC_OR_DEPARTMENT_OTHER)
Admission: RE | Admit: 2021-12-25 | Discharge: 2021-12-25 | Disposition: A | Discharge status: Home / Self Care | Payer: BC Managed Care – PPO | Source: Ambulatory Visit | Attending: Family | Admitting: Family

## 2021-12-25 ENCOUNTER — Ambulatory Visit (HOSPITAL_BASED_OUTPATIENT_CLINIC_OR_DEPARTMENT_OTHER): Payer: BC Managed Care – PPO

## 2021-12-25 ENCOUNTER — Encounter (HOSPITAL_BASED_OUTPATIENT_CLINIC_OR_DEPARTMENT_OTHER): Payer: Self-pay

## 2021-12-25 DIAGNOSIS — Z1231 Encounter for screening mammogram for malignant neoplasm of breast: Secondary | ICD-10-CM | POA: Insufficient documentation

## 2021-12-25 DIAGNOSIS — Z0000 Encounter for general adult medical examination without abnormal findings: Secondary | ICD-10-CM

## 2021-12-27 ENCOUNTER — Telehealth: Payer: Self-pay | Admitting: Family

## 2021-12-27 DIAGNOSIS — R0789 Other chest pain: Secondary | ICD-10-CM

## 2021-12-27 MED ORDER — OMEPRAZOLE 40 MG PO CPDR
DELAYED_RELEASE_CAPSULE | ORAL | 3 refills | Status: DC
Start: 2021-12-27 — End: 2022-04-27

## 2021-12-27 NOTE — Telephone Encounter (Signed)
Medication: omeprazole (PRILOSEC) 40 MG capsule   Has the patient contacted their pharmacy? Yes.    Preferred Pharmacy (with phone number or street name):  [id number] [address], [address] [facility] [address]  [place], [place] [address]  Phone:  [phone]  Fax:  [phone]   Agent: Please be advised that RX refills may take up to 3 business days. We ask that you follow-up with your pharmacy.

## 2022-01-07 ENCOUNTER — Ambulatory Visit (HOSPITAL_BASED_OUTPATIENT_CLINIC_OR_DEPARTMENT_OTHER): Payer: BC Managed Care – PPO

## 2022-03-25 ENCOUNTER — Ambulatory Visit: Payer: BC Managed Care – PPO | Admitting: Family

## 2022-03-25 ENCOUNTER — Telehealth (INDEPENDENT_AMBULATORY_CARE_PROVIDER_SITE_OTHER): Payer: BC Managed Care – PPO | Admitting: Family

## 2022-03-25 DIAGNOSIS — R7989 Other specified abnormal findings of blood chemistry: Secondary | ICD-10-CM | POA: Diagnosis not present

## 2022-03-25 DIAGNOSIS — R945 Abnormal results of liver function studies: Secondary | ICD-10-CM

## 2022-03-25 DIAGNOSIS — R739 Hyperglycemia, unspecified: Secondary | ICD-10-CM

## 2022-03-25 DIAGNOSIS — K219 Gastro-esophageal reflux disease without esophagitis: Secondary | ICD-10-CM | POA: Diagnosis not present

## 2022-03-25 NOTE — Assessment & Plan Note (Signed)
Abd US showed fatty liver and gallbladder sludge.  Repeat LFT's today and obtain acute hepatitis panel.

## 2022-03-25 NOTE — Assessment & Plan Note (Signed)
Stable improved. She is interested in trying to come off of omeprazole.  Recommended trial off. If symptoms return, pt may resume.

## 2022-03-25 NOTE — Assessment & Plan Note (Signed)
Repeat A1C. Was in the borderline DM range.   Lab Results  Component Value Date   HGBA1C 6.0 07/24/2021   Lab Results  Component Value Date   LDLCALC 95 07/23/2020   CREATININE 0.95 07/24/2021

## 2022-03-25 NOTE — Progress Notes (Signed)
MyChart Video Visit    Virtual Visit via Video Note   This visit type was conducted due to national recommendations for restrictions regarding the COVID-19 Pandemic (e.g. social distancing) in an effort to limit this patient's exposure and mitigate transmission in our community. This patient is at least at moderate risk for complications without adequate follow up. This format is felt to be most appropriate for this patient at this time. Physical exam was limited by quality of the video and audio technology used for the visit. CMA was able to get the patient set up on a video visit.  Patient location: Home Patient and provider in visit Provider location: Office  I discussed the limitations of evaluation and management by telemedicine and the availability of in person appointments. The patient expressed understanding and agreed to proceed.  Visit Date: 03/25/2022  Today's healthcare provider: Nance Pear, NP     Subjective:    Patient ID: Nicole Bernard, female    DOB: 04/13/1981, 41 y.o.   MRN: [id number]  Chief Complaint  Patient presents with   Hyperglycemia    Follow up    HPI Patient is in today for a virtual office visit  Hyperglycemia: Her blood sugar levels are borderline Lab Results  Component Value Date   HGBA1C 6.0 07/24/2021   Heartburn: She reports that she had one or two episodes of flare-ups but states that symptoms are mostly controlled. She is currently taking 40 mg of Omeprazole   Immunizations: She has not had her influenza vaccine  Pap Smear: Last completed on 07/24/2021.   Past Medical History:  Diagnosis Date   Abnormal Pap smear of vagina    Anemia    iron deficient   Cholelithiasis    Fatty liver    History of miscarriage    Onychomycosis of toenail    Overweight(278.02)     Past Surgical History:  Procedure Laterality Date   CESAREAN SECTION     GYNECOLOGIC CRYOSURGERY  3704   UMBILICAL HERNIA REPAIR  05/04/2017     Family History  Problem Relation Age of Onset   Heart disease Neg Hx    Hypertension Neg Hx    Diabetes Neg Hx    Cancer Neg Hx    Stroke Neg Hx     Social History   Socioeconomic History   Marital status: Married    Spouse name: Not on file   Number of children: 2   Years of education: Not on file   Highest education level: Not on file  Occupational History   Occupation: nurse    Comment: [employer]  Tobacco Use   Smoking status: Never   Smokeless tobacco: Never  Substance and Sexual Activity   Alcohol use: No    Alcohol/week: 0.0 standard drinks of alcohol   Drug use: No   Sexual activity: Yes    Partners: Male    Birth control/protection: Other-see comments    Comment: husband had vasectomy  Other Topics Concern   Not on file  Social History Narrative   Regular exercise: no   Caffeine Use: no   Lives with husband and 2 children (twin girls 2006)   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     omeprazole (PRILOSEC) 40 MG capsule TAKE 1 CAPSULE(40 MG) BY MOUTH DAILY 30 capsule 3   No facility-administered medications prior to visit.    Allergies  Allergen Reactions   Latex Swelling    Swelling of lips and face.    ROS See HPI    Objective:    Physical Exam   Gen: Awake, alert, no acute distress Resp: Breathing is even and non-labored Psych: calm/pleasant demeanor Neuro: Alert and Oriented x 3, + facial symmetry, speech is clear.   Assessment & Plan:   Problem List Items Addressed This  Visit       Unprioritized   Hyperglycemia - Primary    Repeat A1C. Was in the borderline DM range.   Lab Results  Component Value Date   HGBA1C 6.0 07/24/2021   Lab Results  Component Value Date   LDLCALC 95 07/23/2020   CREATININE 0.95 07/24/2021         Relevant Orders   Hemoglobin A1c   Comp Met (CMET)   Gastroesophageal reflux disease    Stable improved. She is interested in trying to come off of omeprazole.  Recommended trial off. If symptoms return, pt may resume.       Abnormal LFTs    Abd US showed fatty liver and gallbladder sludge.  Repeat LFT's today and obtain acute hepatitis panel.       Relevant Orders   Hepatitis, Acute   No orders of the defined types were placed in this encounter.   I discussed the assessment and treatment plan with the patient. The patient was provided an opportunity to ask questions and all were answered. The patient agreed with the plan and demonstrated an understanding of the instructions.   The patient was advised to call back or seek an in-person evaluation if the symptoms worsen or if the condition fails to improve as anticipated.   I,Amber Collins,acting as a Education administrator for [employer], NP.,have documented all relevant documentation on the behalf of Nance Pear, NP,as directed by  Nance Pear, NP while in the presence of Nance Pear, NP.   Nance Pear, NP [employer] [employer] [facility] [phone] (phone) [phone] (fax)  [place]

## 2022-03-27 ENCOUNTER — Other Ambulatory Visit: Payer: BC Managed Care – PPO

## 2022-03-28 ENCOUNTER — Other Ambulatory Visit: Payer: BC Managed Care – PPO

## 2022-03-31 ENCOUNTER — Other Ambulatory Visit (INDEPENDENT_AMBULATORY_CARE_PROVIDER_SITE_OTHER): Payer: BC Managed Care – PPO

## 2022-03-31 DIAGNOSIS — R7989 Other specified abnormal findings of blood chemistry: Secondary | ICD-10-CM

## 2022-03-31 DIAGNOSIS — R739 Hyperglycemia, unspecified: Secondary | ICD-10-CM | POA: Diagnosis not present

## 2022-04-01 LAB — COMPREHENSIVE METABOLIC PANEL
ALT: 9 U/L (ref 0–35)
AST: 12 U/L (ref 0–37)
Albumin: 4 g/dL (ref 3.5–5.2)
Alkaline Phosphatase: 68 U/L (ref 39–117)
BUN: 13 mg/dL (ref 6–23)
CO2: 30 mEq/L (ref 19–32)
Calcium: 9.1 mg/dL (ref 8.4–10.5)
Chloride: 101 mEq/L (ref 96–112)
Creatinine, Ser: 1.01 mg/dL (ref 0.40–1.20)
GFR: 68.09 mL/min (ref >60.00)
Glucose, Bld: 90 mg/dL (ref 70–99)
Potassium: 3.7 mEq/L (ref 3.5–5.1)
Sodium: 137 mEq/L (ref 135–145)
Total Bilirubin: 0.5 mg/dL (ref 0.2–1.2)
Total Protein: 6.8 g/dL (ref 6.0–8.3)

## 2022-04-01 LAB — HEPATITIS PANEL, ACUTE
Hep A IgM: NONREACTIVE
Hep B C IgM: NONREACTIVE
Hepatitis B Surface Ag: NONREACTIVE
Hepatitis C Ab: NONREACTIVE

## 2022-04-01 LAB — HEPATIC FUNCTION PANEL
ALT: 9 U/L (ref 0–35)
AST: 12 U/L (ref 0–37)
Albumin: 4 g/dL (ref 3.5–5.2)
Alkaline Phosphatase: 68 U/L (ref 39–117)
Bilirubin, Direct: 0.1 mg/dL (ref 0.0–0.3)
Total Bilirubin: 0.5 mg/dL (ref 0.2–1.2)
Total Protein: 6.8 g/dL (ref 6.0–8.3)

## 2022-04-01 LAB — HEMOGLOBIN A1C: Hgb A1c MFr Bld: 6 % (ref 4.6–6.5)

## 2022-04-19 IMAGING — US US ABDOMEN COMPLETE
1 series · 14 of 25 positions shown · non-contrast
Comparison: None.

CLINICAL DATA: Abnormal LFT

EXAM:
ABDOMEN ULTRASOUND COMPLETE

[Series 1: us abdomen complete · 14 of 78 slices shown]
[im 1/78]
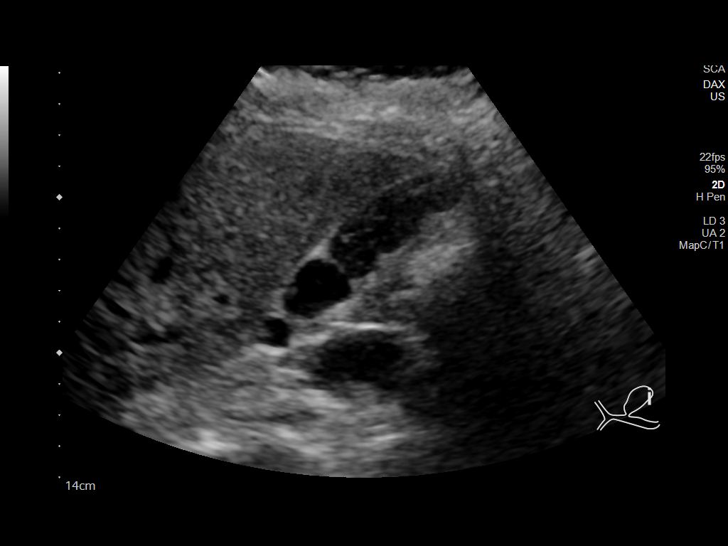
[im 7/78]
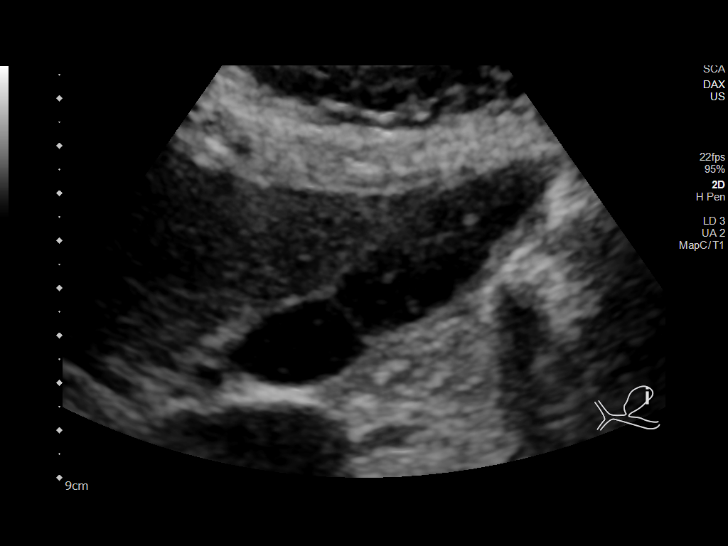
[im 13/78]
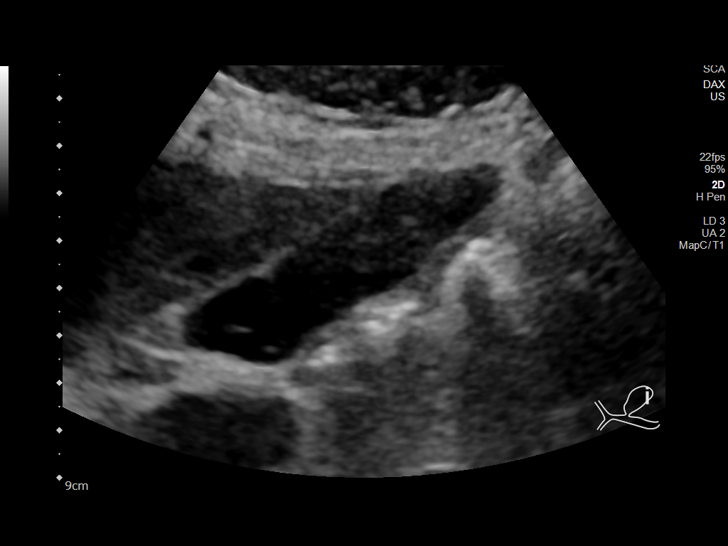
[im 20/78]
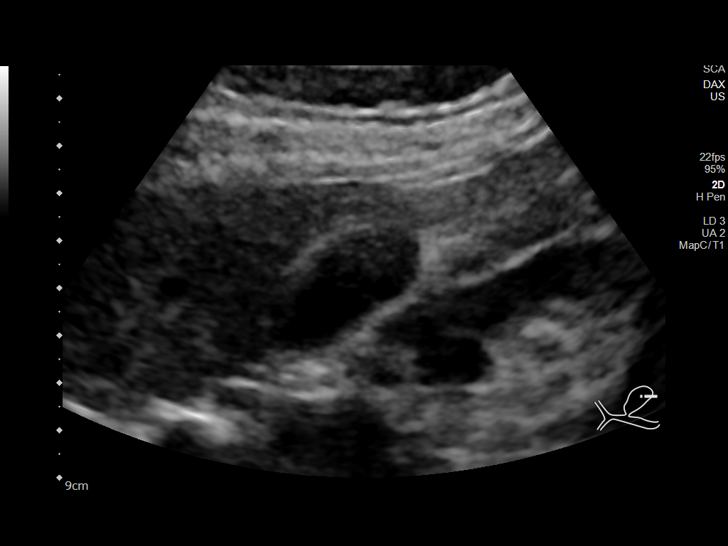
[im 26/78]
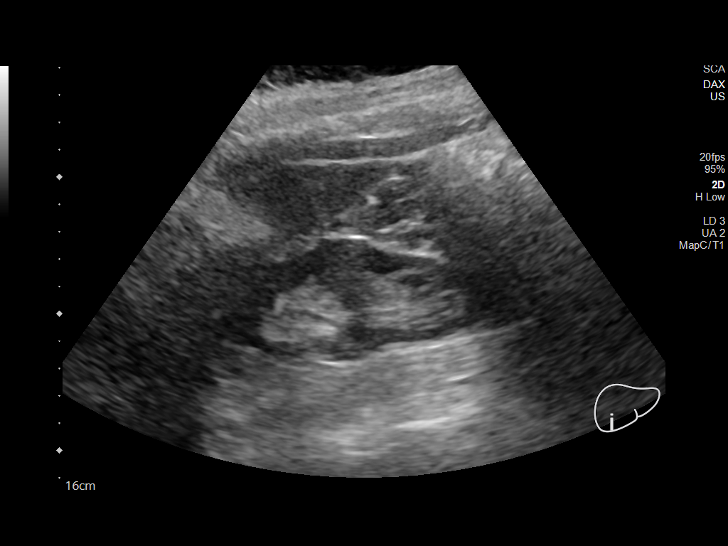
[im 29/78]
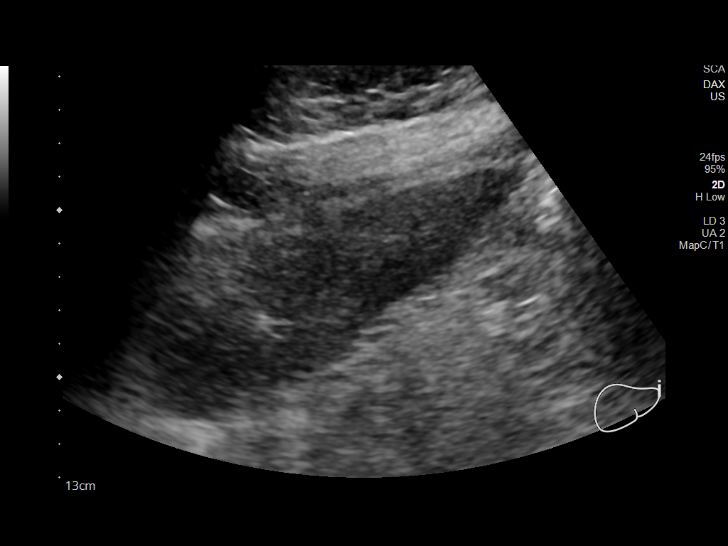
[im 36/78]
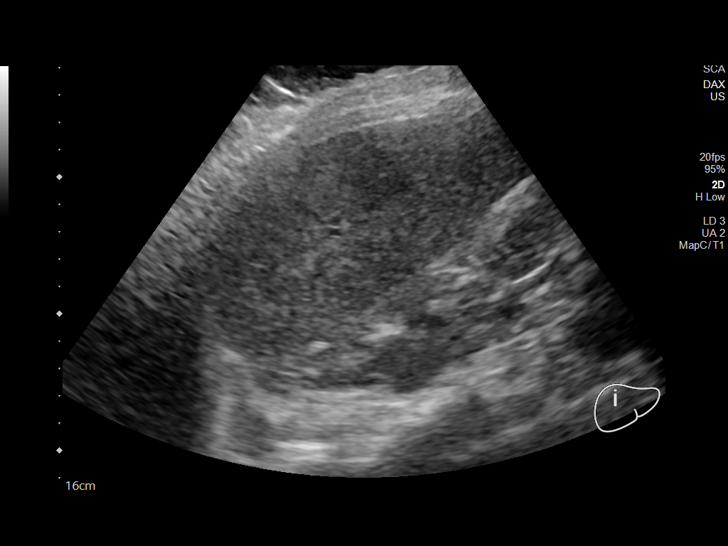
[im 42/78]
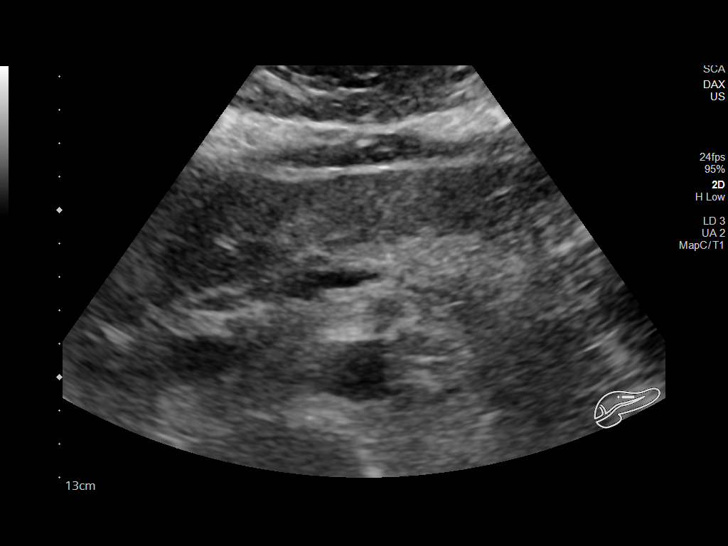
[im 49/78]
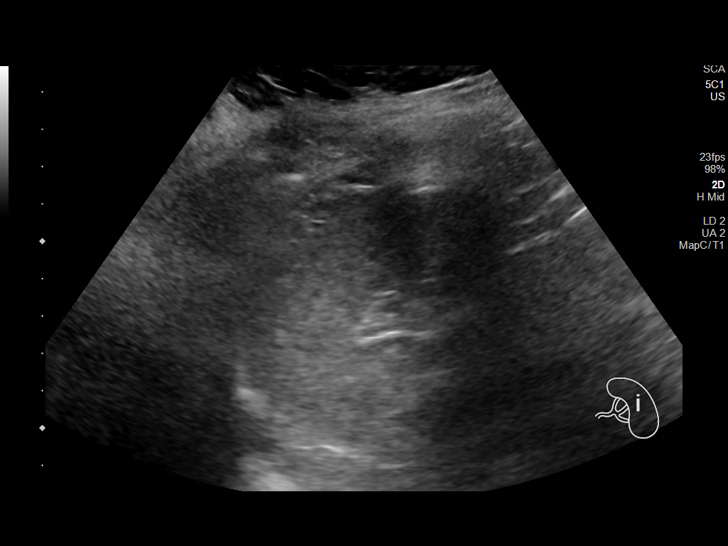
[im 52/78]
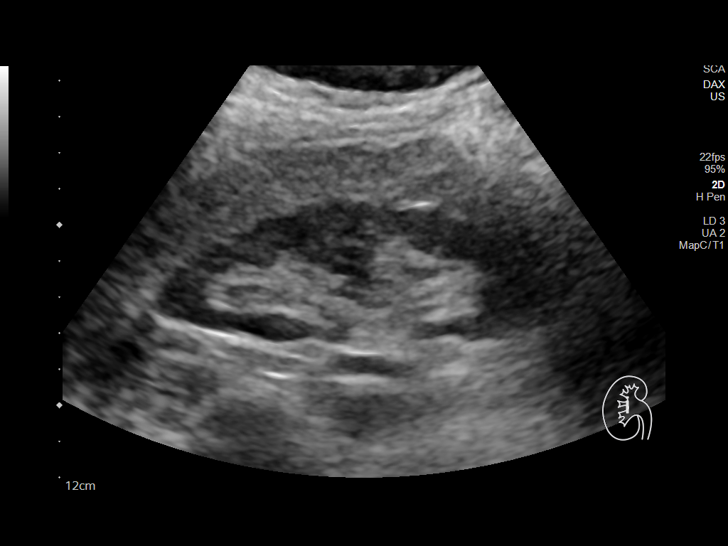
[im 58/78]
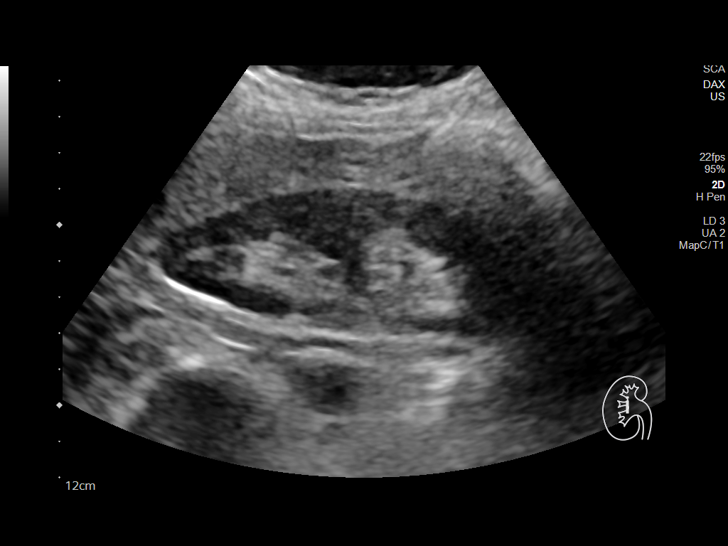
[im 65/78]
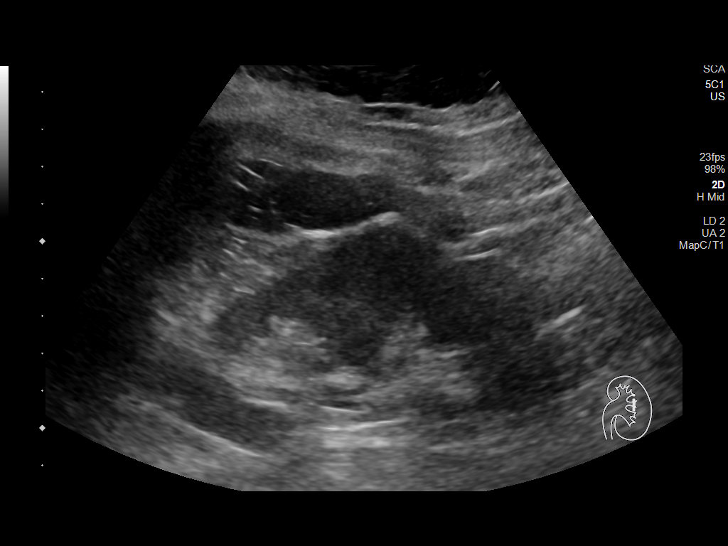
[im 71/78]
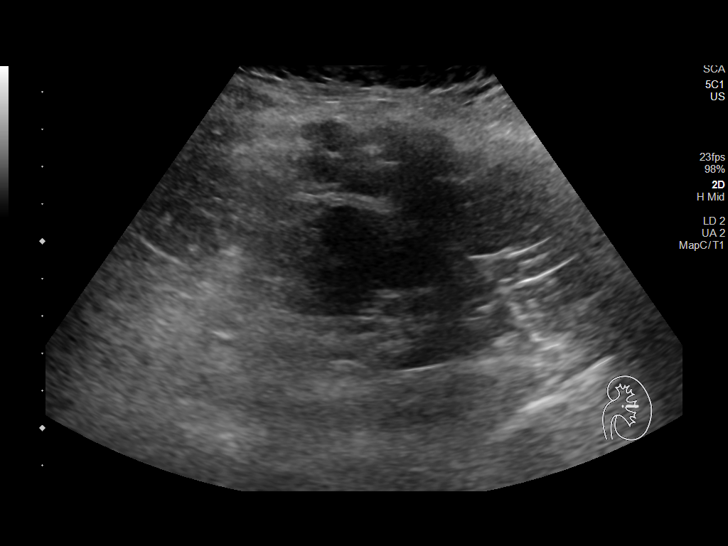
[im 78/78]
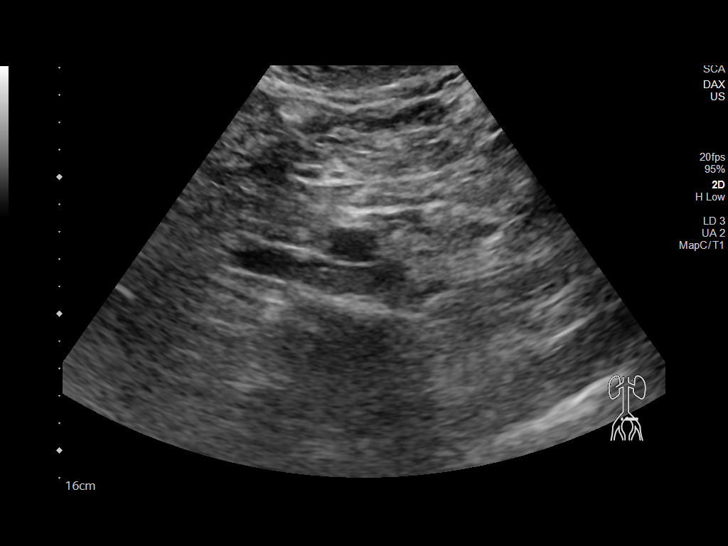

[14 of 25 positions shown; findings below may reference images not displayed]

FINDINGS: Gallbladder: Sludge and punctate stones in the gallbladder. Normal
wall thickness. Negative sonographic Murphy.

Common bile duct: Diameter: 3.7 mm

Liver: Slightly echogenic hepatic parenchyma. No focal hepatic
abnormality. Portal vein is patent on color Doppler imaging with
normal direction of blood flow towards the liver.

IVC: No abnormality visualized.

Pancreas: Visualized portion unremarkable.

Spleen: Size and appearance within normal limits.

Right Kidney: Length: 10.5 cm. Echogenicity within normal limits. No
mass or hydronephrosis visualized.

Left Kidney: Length: 10.7 cm. Echogenicity within normal limits. No
mass or hydronephrosis visualized.

Abdominal aorta: No aneurysm visualized.

Other findings: None.
IMPRESSION: 1. Sludge and stones in the gallbladder without sonographic features
to suggest acute cholecystitis
2. Echogenic liver parenchyma consistent with hepatic steatosis and
or hepatocellular disease

## 2022-04-27 ENCOUNTER — Other Ambulatory Visit: Payer: Self-pay | Admitting: Family

## 2022-04-27 DIAGNOSIS — R0789 Other chest pain: Secondary | ICD-10-CM

## 2022-07-28 ENCOUNTER — Encounter: Payer: No Typology Code available for payment source | Admitting: Family

## 2022-07-29 ENCOUNTER — Encounter: Payer: No Typology Code available for payment source | Admitting: Family

## 2022-07-29 NOTE — Progress Notes (Incomplete)
Subjective:   By signing my name below, I, Madelin Rear, attest that this documentation has been prepared under the direction and in the presence of [name], NP. 07/29/2022.   Patient ID: Nicole Bernard, female    DOB: Jul 08, 1980, 42 y.o.   MRN: [id number]  No chief complaint on file.   HPI Patient is in today for a comprehensive physical exam.    Social history: Colonoscopy:- Dexa:- Pap Smear:  Last completed 07/24/2021. Mammogram:  Last completed 12/25/2021. Immunizations:  Influenza vaccine last received 04/18/2014.  Tdap last received 06/16/2016. Never received a Covid-19 vaccination. Diet: Exercise: Dental: Vision:  Denies having any fever, new muscle pain, joint pain, new moles, congestion, sinus pain, sore throat, chest pain, palpitations, cough, SOB, wheezing, n/v/d, constipation, blood in stool, dysuria, frequency, hematuria, at this time.  Past Medical History:  Diagnosis Date   Abnormal Pap smear of vagina    Anemia    iron deficient   Cholelithiasis    Fatty liver    History of miscarriage    Onychomycosis of toenail    Overweight(278.02)     Past Surgical History:  Procedure Laterality Date   CESAREAN SECTION     GYNECOLOGIC CRYOSURGERY  123456   UMBILICAL HERNIA REPAIR  05/04/2017    Family History  Problem Relation Age of Onset   Heart disease Neg Hx    Hypertension Neg Hx    Diabetes Neg Hx    Cancer Neg Hx    Stroke Neg Hx     Social History   Socioeconomic History   Marital status: Married    Spouse name: Not on file   Number of children: 2   Years of education: Not on file   Highest education level: Not on file  Occupational History   Occupation: nurse    Comment: [employer]  Tobacco Use   Smoking status: Never   Smokeless tobacco: Never  Substance and Sexual Activity   Alcohol use: No    Alcohol/week: 0.0 standard drinks of alcohol   Drug use: No   Sexual activity: Yes    Partners: Male    Birth  control/protection: Other-see comments    Comment: husband had vasectomy  Other Topics Concern   Not on file  Social History Narrative   Regular exercise: no   Caffeine Use: no   Lives with husband and 2 children (twin girls 2006)   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     omeprazole (PRILOSEC) 40 MG capsule TAKE 1 CAPSULE(40 MG) BY MOUTH DAILY 90 capsule 1   No facility-administered medications prior to visit.    Allergies  Allergen Reactions   Latex Swelling    Swelling of lips and face.    Review of Systems  Constitutional:  Negative for fever.  HENT:  Negative for congestion, sinus pain and sore throat.   Respiratory:  Negative for cough, shortness of breath and wheezing.  Cardiovascular:  Negative for chest pain and palpitations.  Gastrointestinal:  Negative for blood in stool, constipation, diarrhea, nausea and vomiting.  Genitourinary:  Negative for dysuria, frequency and hematuria.  Musculoskeletal:  Negative for joint pain and myalgias.       Objective:    Physical Exam Constitutional:      Appearance: Normal appearance.  HENT:     Head: Normocephalic and atraumatic.     Right Ear: Tympanic membrane, ear canal and external ear normal.     Left Ear: Tympanic membrane, ear canal and external ear normal.  Eyes:     Extraocular Movements: Extraocular movements intact.     Pupils: Pupils are equal, round, and reactive to light.  Cardiovascular:     Rate and Rhythm: Normal rate and regular rhythm.      Heart sounds: Normal heart sounds. No murmur heard.    No gallop.  Pulmonary:     Effort: Pulmonary effort is normal. No respiratory distress.     Breath sounds: Normal breath sounds. No wheezing or rales.  Abdominal:     General: Bowel sounds are normal. There is no distension.     Palpations: Abdomen is soft.     Tenderness: There is no abdominal tenderness. There is no guarding.  Musculoskeletal:        General: Normal range of motion.  Skin:    General: Skin is warm and dry.  Neurological:     General: No focal deficit present.     Mental Status: She is alert and oriented to person, place, and time.  Psychiatric:        Mood and Affect: Mood normal.        Behavior: Behavior normal.     There were no vitals taken for this visit. Wt Readings from Last 3 Encounters:  09/17/21 242 lb (109.8 kg)  07/24/21 239 lb (108.4 kg)  07/23/20 241 lb (109.3 kg)    Diabetic Foot Exam - Simple   No data filed    Lab Results  Component Value Date   WBC 5.5 07/24/2021   HGB 12.1 07/24/2021   HCT 36.8 07/24/2021   PLT 233.0 07/24/2021   GLUCOSE 90 03/31/2022   CHOL 167 07/23/2020   TRIG 91.0 07/23/2020   HDL 53.20 07/23/2020   LDLCALC 95 07/23/2020   ALT 9 03/31/2022   ALT 9 03/31/2022   AST 12 03/31/2022   AST 12 03/31/2022   NA 137 03/31/2022   K 3.7 03/31/2022   CL 101 03/31/2022   CREATININE 1.01 03/31/2022   BUN 13 03/31/2022   CO2 30 03/31/2022   TSH 0.92 07/23/2020   HGBA1C 6.0 03/31/2022    Lab Results  Component Value Date   TSH 0.92 07/23/2020   Lab Results  Component Value Date   WBC 5.5 07/24/2021   HGB 12.1 07/24/2021   HCT 36.8 07/24/2021   MCV 80.0 07/24/2021   PLT 233.0 07/24/2021   Lab Results  Component Value Date   NA 137 03/31/2022   K 3.7 03/31/2022   CO2 30 03/31/2022   GLUCOSE 90 03/31/2022   BUN 13 03/31/2022   CREATININE 1.01 03/31/2022   BILITOT 0.5 03/31/2022   BILITOT 0.5 03/31/2022   ALKPHOS 68 03/31/2022   ALKPHOS 68  03/31/2022   AST 12 03/31/2022   AST 12 03/31/2022   ALT 9 03/31/2022   ALT 9 03/31/2022   PROT 6.8 03/31/2022   PROT 6.8 03/31/2022   ALBUMIN 4.0 03/31/2022  ALBUMIN 4.0 03/31/2022   CALCIUM 9.1 03/31/2022   GFR 68.09 03/31/2022   Lab Results  Component Value Date   CHOL 167 07/23/2020   Lab Results  Component Value Date   HDL 53.20 07/23/2020   Lab Results  Component Value Date   LDLCALC 95 07/23/2020   Lab Results  Component Value Date   TRIG 91.0 07/23/2020   Lab Results  Component Value Date   CHOLHDL 3 07/23/2020   Lab Results  Component Value Date   HGBA1C 6.0 03/31/2022       Assessment & Plan:   Problem List Items Addressed This Visit   None    No orders of the defined types were placed in this encounter.   Alphonzo Grieve, personally preformed the services described in this documentation.  All medical record entries made by the scribe were at my direction and in my presence.  I have reviewed the chart and discharge instructions (if applicable) and agree that the record reflects my personal performance and is accurate and complete. 07/29/2022.  I,Mathew Stumpf,acting as a Education administrator for [employer], NP.,have documented all relevant documentation on the behalf of Nicole Pear, NP,as directed by  Nicole Pear, NP while in the presence of Nicole Pear, NP.   Madelin Rear

## 2022-08-01 ENCOUNTER — Ambulatory Visit (INDEPENDENT_AMBULATORY_CARE_PROVIDER_SITE_OTHER): Payer: BC Managed Care – PPO | Admitting: Family

## 2022-08-01 ENCOUNTER — Encounter: Payer: Self-pay | Admitting: Family

## 2022-08-01 ENCOUNTER — Ambulatory Visit (HOSPITAL_BASED_OUTPATIENT_CLINIC_OR_DEPARTMENT_OTHER)
Admission: RE | Admit: 2022-08-01 | Discharge: 2022-08-01 | Disposition: A | Discharge status: Home / Self Care | Payer: BC Managed Care – PPO | Source: Ambulatory Visit | Attending: Family | Admitting: Family

## 2022-08-01 VITALS — BP 122/73 | HR 62 | Temp 97.6°F | Resp 16 | Ht 65.5 in | Wt 247.0 lb

## 2022-08-01 DIAGNOSIS — R600 Localized edema: Secondary | ICD-10-CM | POA: Insufficient documentation

## 2022-08-01 DIAGNOSIS — Z0001 Encounter for general adult medical examination with abnormal findings: Secondary | ICD-10-CM

## 2022-08-01 DIAGNOSIS — R739 Hyperglycemia, unspecified: Secondary | ICD-10-CM | POA: Diagnosis not present

## 2022-08-01 LAB — LIPID PANEL
Cholesterol: 174 mg/dL (ref 0–200)
HDL: 46.6 mg/dL (ref >39.00)
LDL Cholesterol: 94 mg/dL (ref 0–99)
NonHDL: 126.93
Total CHOL/HDL Ratio: 4
Triglycerides: 166 mg/dL — ABNORMAL HIGH (ref 0.0–149.0)
VLDL: 33.2 mg/dL (ref 0.0–40.0)

## 2022-08-01 LAB — COMPREHENSIVE METABOLIC PANEL
ALT: 13 U/L (ref 0–35)
AST: 14 U/L (ref 0–37)
Albumin: 3.7 g/dL (ref 3.5–5.2)
Alkaline Phosphatase: 72 U/L (ref 39–117)
BUN: 12 mg/dL (ref 6–23)
CO2: 30 mEq/L (ref 19–32)
Calcium: 9.5 mg/dL (ref 8.4–10.5)
Chloride: 103 mEq/L (ref 96–112)
Creatinine, Ser: 0.97 mg/dL (ref 0.40–1.20)
GFR: 71.3 mL/min (ref >60.00)
Glucose, Bld: 98 mg/dL (ref 70–99)
Potassium: 4.2 mEq/L (ref 3.5–5.1)
Sodium: 140 mEq/L (ref 135–145)
Total Bilirubin: 0.5 mg/dL (ref 0.2–1.2)
Total Protein: 6.5 g/dL (ref 6.0–8.3)

## 2022-08-01 LAB — TSH: TSH: 1.56 u[IU]/mL (ref 0.35–5.50)

## 2022-08-01 LAB — HEMOGLOBIN A1C: Hgb A1c MFr Bld: 5.9 % (ref 4.6–6.5)

## 2022-08-01 NOTE — Assessment & Plan Note (Signed)
New.  Will obtain RLE doppler.

## 2022-08-01 NOTE — Assessment & Plan Note (Addendum)
Wt Readings from Last 3 Encounters:  08/01/22 247 lb (112 kg)  09/17/21 242 lb (109.8 kg)  07/24/21 239 lb (108.4 kg)   Discussed healthy diet, regular exercise, declines flu and covid vaccines.  Pap up to date. Offered referral to plastic surgery for rectus diastasis- she declines at this time.

## 2022-08-01 NOTE — Progress Notes (Signed)
Subjective:   By signing my name below, I, Madelin Rear, attest that this documentation has been prepared under the direction and in the presence of [name], NP. 08/01/2022.    Patient ID: Nicole Bernard, female    DOB: [birth year], 42 y.o.   MRN: [id number]  Chief Complaint  Patient presents with   Annual Exam    HPI Patient is in today for a comprehensive physical exam.  RLE edema:  Over the last few years, she has noticed intermittent swelling of her right leg. This usually occurs after standing or walking for a long period of time. Today her right LE does appear more swollen than the left. Recently she had a long drive from [place]. She denies calf pain. We have scheduled an ultrasound for 11:30 AM today.  Acid reflux:  Most recent flare-up occurred while she was in [place]. Prior to that it had been a little while since her last episode.  Social history:  No new surgeries or procedures in the prior year. No alcohol use. No drug use. No tobacco or vaping use. She has fraternal twin daughters with no health issues. She is not on birth control; her husband had a vasectomy. She continues to work at [employer].  Family history:  Her paternal grandfather was a smoker, had bone marrow cancer. Her paternal grandmother was fairly healthy and a non-smoker, she died in her 5's with COPD. Her mother died at a young age. She has a brother with no known health issues.   Pap Smear:  Last completed 07/24/2021.  Mammogram:  Last completed 12/25/2021.  Immunizations:  Never received Covid-19 or Shingrix vaccinations. Influenza vaccine last received 04/18/2014.  Tdap last received 06/16/2016.  Diet:  She admits to not doing as well as she would like. Sometimes she will skip a meal. She has bowel movements a few times a week. Of note, she states that she "doesn't go like she should."   Exercise:  When she was exercising routinely she was participating in weight lifting.  Denies  having any fever, new muscle pain, joint pain, new moles, congestion, sinus pain, sore throat, chest pain, palpitations, cough, SOB, wheezing, n/v/d, constipation, blood in stool, dysuria, frequency, hematuria, at this time.  Past Medical History:  Diagnosis Date   Abnormal Pap smear of vagina    Anemia    iron deficient   Cholelithiasis    Fatty liver    History of miscarriage    Onychomycosis of toenail    Overweight(278.02)    PAP SMEAR, ABNORMAL 11/09/2007   Qualifier: Diagnosis of   By: Larose Kells MD, [place]       Past Surgical History:  Procedure Laterality Date   CESAREAN SECTION     GYNECOLOGIC CRYOSURGERY  123456   UMBILICAL HERNIA REPAIR  05/04/2017    Family History  Problem Relation Age of Onset   COPD Paternal Grandmother    Cancer Paternal Grandfather        "bone cancer"   Heart disease Neg Hx    Hypertension Neg Hx    Diabetes Neg Hx    Stroke Neg Hx     Social History   Socioeconomic History   Marital status: Married    Spouse name: Not on file   Number of children: 2   Years of education: Not on file   Highest education level: Not on file  Occupational History   Occupation: nurse    Comment: [employer]  Tobacco Use   Smoking status:  Never   Smokeless tobacco: Never  Substance and Sexual Activity   Alcohol use: No    Alcohol/week: 0.0 standard drinks of alcohol   Drug use: No   Sexual activity: Yes    Partners: Male    Birth control/protection: Other-see comments    Comment: husband had vasectomy  Other Topics Concern   Not on file  Social History Narrative   Regular exercise: no   Caffeine Use: no   Lives with husband and 2 children (twin girls 2006)   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     omeprazole (PRILOSEC) 40 MG capsule TAKE 1 CAPSULE(40 MG) BY MOUTH DAILY 90 capsule 1   No facility-administered medications prior to visit.    Allergies  Allergen Reactions   Latex Swelling    Swelling of lips and face.    Review of Systems  Constitutional:  Negative for fever.  HENT:  Negative for congestion, sinus pain and sore throat.   Respiratory:  Negative for cough, shortness of breath and wheezing.   Cardiovascular:  Positive for leg swelling (Right > Left). Negative for chest pain and palpitations.  Gastrointestinal:  Negative for blood in stool, constipation, diarrhea, nausea and vomiting.  Genitourinary:  Negative for dysuria, frequency and hematuria.  Musculoskeletal:  Negative for joint pain and myalgias.       Objective:    Physical Exam Constitutional:      Appearance: Normal appearance.  HENT:     Head: Normocephalic and atraumatic.     Right Ear: Tympanic membrane, ear canal and external ear normal.     Left Ear: Tympanic membrane, ear canal and external ear normal.  Eyes:     Extraocular Movements: Extraocular movements intact.     Pupils: Pupils are equal, round, and reactive to light.  Cardiovascular:     Rate and Rhythm: Normal rate and regular rhythm.     Heart sounds: Normal heart sounds. No murmur heard.    No gallop.  Pulmonary:     Effort: Pulmonary effort is normal. No respiratory distress.     Breath sounds: Normal breath sounds. No wheezing or rales.  Abdominal:     General: Bowel sounds are normal. There is no distension.     Palpations: Abdomen is soft.     Tenderness: There is no abdominal tenderness. There is no guarding.     Comments: Rectus diastasis noted.  Musculoskeletal:        General: Normal range of motion.      Right lower leg: 2+ Edema present.     Left lower leg: No edema.     Comments: 5/5 muscle strength of upper and lower extremities.  Skin:    General: Skin is warm and dry.  Neurological:     General: No focal deficit present.     Mental Status: She is alert and oriented to person, place, and time.  Psychiatric:  Mood and Affect: Mood normal.        Behavior: Behavior normal.     BP 122/73 (BP Location: Left Arm, Patient Position: Sitting, Cuff Size: Large)   Pulse 62   Temp 97.6 F (36.4 C) (Oral)   Resp 16   Ht 5' 5.5" (1.664 m)   Wt 247 lb (112 kg)   SpO2 98%   BMI 40.48 kg/m  Wt Readings from Last 3 Encounters:  08/01/22 247 lb (112 kg)  09/17/21 242 lb (109.8 kg)  07/24/21 239 lb (108.4 kg)         Assessment & Plan:   Problem List Items Addressed This Visit       Unprioritized   Preventative health care    Wt Readings from Last 3 Encounters:  08/01/22 247 lb (112 kg)  09/17/21 242 lb (109.8 kg)  07/24/21 239 lb (108.4 kg)  Discussed healthy diet, regular exercise, declines flu and covid vaccines.  Pap up to date. Offered referral to plastic surgery for rectus diastasis- she declines at this time.       Hyperglycemia - Primary   Relevant Orders   HgB A1c   Comp Met (CMET)   Lipid panel   TSH   Edema of right lower extremity    New.  Will obtain RLE doppler.        Relevant Orders   US Venous Img Lower Unilateral Right (DVT)     No orders of the defined types were placed in this encounter.   I, Nance Pear, NP, personally preformed the services described in this documentation.  All medical record entries made by the scribe were at my direction and in my presence.  I have reviewed the chart and discharge instructions (if applicable) and agree that the record reflects my personal performance and is accurate and complete.  08/01/2022.  I,Mathew Stumpf,acting as a Education administrator for [employer], NP.,have documented all relevant  documentation on the behalf of Nance Pear, NP,as directed by  Nance Pear, NP while in the presence of Nance Pear, NP.   Nance Pear, NP

## 2022-09-12 ENCOUNTER — Other Ambulatory Visit: Payer: Self-pay | Admitting: *Deleted

## 2022-09-12 DIAGNOSIS — R0789 Other chest pain: Secondary | ICD-10-CM

## 2022-09-12 MED ORDER — OMEPRAZOLE 40 MG PO CPDR
40.0000 mg | DELAYED_RELEASE_CAPSULE | Freq: Every day | ORAL | 1 refills | Status: DC
Start: 2022-09-12 — End: 2023-04-30

## 2022-12-23 ENCOUNTER — Other Ambulatory Visit (HOSPITAL_BASED_OUTPATIENT_CLINIC_OR_DEPARTMENT_OTHER): Payer: Self-pay | Admitting: Family

## 2022-12-23 DIAGNOSIS — Z1231 Encounter for screening mammogram for malignant neoplasm of breast: Secondary | ICD-10-CM

## 2022-12-29 ENCOUNTER — Ambulatory Visit (HOSPITAL_BASED_OUTPATIENT_CLINIC_OR_DEPARTMENT_OTHER)
Admission: RE | Admit: 2022-12-29 | Discharge: 2022-12-29 | Disposition: A | Discharge status: Home / Self Care | Payer: BC Managed Care – PPO | Source: Ambulatory Visit | Attending: Family | Admitting: Family

## 2022-12-29 ENCOUNTER — Inpatient Hospital Stay (HOSPITAL_BASED_OUTPATIENT_CLINIC_OR_DEPARTMENT_OTHER): Admission: RE | Admit: 2022-12-29 | Payer: BC Managed Care – PPO | Source: Ambulatory Visit

## 2022-12-29 DIAGNOSIS — Z1231 Encounter for screening mammogram for malignant neoplasm of breast: Secondary | ICD-10-CM | POA: Insufficient documentation

## 2023-04-30 ENCOUNTER — Other Ambulatory Visit: Payer: Self-pay | Admitting: Family

## 2023-04-30 DIAGNOSIS — R0789 Other chest pain: Secondary | ICD-10-CM

## 2023-07-14 ENCOUNTER — Encounter: Payer: BC Managed Care – PPO | Admitting: Family

## 2023-07-17 ENCOUNTER — Encounter: Payer: BC Managed Care – PPO | Admitting: Family

## 2023-07-21 ENCOUNTER — Encounter: Payer: BC Managed Care – PPO | Admitting: Family

## 2023-08-03 ENCOUNTER — Encounter: Payer: BC Managed Care – PPO | Admitting: Family

## 2023-08-05 ENCOUNTER — Encounter: Payer: Self-pay | Admitting: Family

## 2023-08-05 ENCOUNTER — Ambulatory Visit: Admitting: Family

## 2023-08-05 ENCOUNTER — Encounter: Admitting: Family

## 2023-08-05 VITALS — BP 128/76 | HR 61 | Temp 98.6°F | Resp 16 | Ht 65.5 in | Wt 243.0 lb

## 2023-08-05 DIAGNOSIS — Z1211 Encounter for screening for malignant neoplasm of colon: Secondary | ICD-10-CM

## 2023-08-05 DIAGNOSIS — R739 Hyperglycemia, unspecified: Secondary | ICD-10-CM

## 2023-08-05 DIAGNOSIS — E781 Pure hyperglyceridemia: Secondary | ICD-10-CM | POA: Diagnosis not present

## 2023-08-05 DIAGNOSIS — Z0000 Encounter for general adult medical examination without abnormal findings: Secondary | ICD-10-CM | POA: Diagnosis not present

## 2023-08-05 LAB — COMPREHENSIVE METABOLIC PANEL
ALT: 16 U/L (ref 0–35)
AST: 17 U/L (ref 0–37)
Albumin: 3.8 g/dL (ref 3.5–5.2)
Alkaline Phosphatase: 84 U/L (ref 39–117)
BUN: 14 mg/dL (ref 6–23)
CO2: 30 meq/L (ref 19–32)
Calcium: 9 mg/dL (ref 8.4–10.5)
Chloride: 102 meq/L (ref 96–112)
Creatinine, Ser: 1 mg/dL (ref 0.40–1.20)
GFR: 68.26 mL/min (ref >60.00)
Glucose, Bld: 90 mg/dL (ref 70–99)
Potassium: 4 meq/L (ref 3.5–5.1)
Sodium: 137 meq/L (ref 135–145)
Total Bilirubin: 0.4 mg/dL (ref 0.2–1.2)
Total Protein: 6.4 g/dL (ref 6.0–8.3)

## 2023-08-05 LAB — LIPID PANEL
Cholesterol: 177 mg/dL (ref 0–200)
HDL: 48 mg/dL (ref >39.00)
LDL Cholesterol: 86 mg/dL (ref 0–99)
NonHDL: 128.94
Total CHOL/HDL Ratio: 4
Triglycerides: 213 mg/dL — ABNORMAL HIGH (ref 0.0–149.0)
VLDL: 42.6 mg/dL — ABNORMAL HIGH (ref 0.0–40.0)

## 2023-08-05 LAB — HEMOGLOBIN A1C: Hgb A1c MFr Bld: 6 % (ref 4.6–6.5)

## 2023-08-05 NOTE — Patient Instructions (Signed)
 VISIT SUMMARY:  Kaleya Douse, a 43 year old female, came in for her annual physical exam. She has no current health concerns but has noticed swelling in her right leg, which she attributes to recent weight gain. Her weight has increased from 242 pounds last year to 247 pounds currently. Her past medical history includes borderline blood sugar levels and elevated triglycerides. She has not received a flu shot this past year, and her mammogram is due at the end of August. All other routine exams are up to date.  YOUR PLAN:  -GENERAL HEALTH MAINTENANCE: This was a routine visit, and your tetanus immunization is up to date. You are due for a colonoscopy, and your last Pap smear was negative for HPV. Your weight has increased, and we discussed lifestyle modifications to help manage this, such as reducing evening snacks and sugared drinks, and increasing physical activity like walking. We will also order lab work to check your A1c and triglycerides levels.  INSTRUCTIONS:  Please schedule a colonoscopy with a gastroenterologist. Additionally, we have ordered lab work to check your A1c and triglycerides levels. Make sure to follow the lifestyle modifications we discussed, including reducing evening snacks and sugared drinks, and increasing your physical activity. Your mammogram is due at the end of August, so please ensure that is scheduled as well.

## 2023-08-05 NOTE — Progress Notes (Signed)
 Subjective:     Patient ID: Nicole Bernard, female    DOB: 03-07-1981, 43 y.o.   MRN: [id number]  Chief Complaint  Patient presents with   Annual Exam    HPI  Discussed the use of AI scribe software for clinical note transcription with the patient, who gave verbal consent to proceed.  History of Present Illness  Nicole Bernard is a 43 year old female who presents for an annual physical exam.  No current cough, cold symptoms, skin rashes, moles of concern, hearing or vision issues, digestive concerns, urinary issues, unusual muscle or joint pain, frequent headaches, or concerns about depression or anxiety. Periods are normal. Tetanus immunization is up to date. She has not received a flu shot this past year. Last Pap smear in March 2023 was negative for HPV, allowing for a 5-year interval. Mammogram is due at the end of August. Vision and dental exams are up to date.  Her weight has increased from 242 pounds last year to 247 pounds currently. She describes her diet and exercise routine as 'fair' and acknowledges that it needs improvement. No alcohol, drug use, tobacco, or vaping. Her husband has had a vasectomy for birth control purposes.  She reports swelling in her right leg, particularly after long walks, which she associates with her recent weight gain. She denies any injury to that side.  Her past medical history includes borderline blood sugar levels and elevated triglycerides, which have been noted in previous lab work.  Immunizations: tetanus up to date Diet: needs improvement Exercise: needs improvement Colonoscopy: due Pap Smear: 07/24/2021 Mammogram: 12/29/22 Vision:  up to date Dental:  up to date Wt Readings from Last 3 Encounters:  08/05/23 243 lb (110.2 kg)  08/01/22 247 lb (112 kg)  09/17/21 242 lb (109.8 kg)           Health Maintenance Due  Topic Date Due   Colonoscopy  Never done    Past Medical History:  Diagnosis Date    Abnormal Pap smear of vagina    Anemia    iron deficient   Cholelithiasis    Fatty liver    History of miscarriage    Onychomycosis of toenail    Overweight(278.02)    PAP SMEAR, ABNORMAL 11/09/2007   Qualifier: Diagnosis of   By: Drue Novel MD, Nolon Rod.       Past Surgical History:  Procedure Laterality Date   CESAREAN SECTION     GYNECOLOGIC CRYOSURGERY  2003   UMBILICAL HERNIA REPAIR  05/04/2017    Family History  Problem Relation Age of Onset   COPD Paternal Grandmother    Cancer Paternal Grandfather        "bone cancer"   Heart disease Neg Hx    Hypertension Neg Hx    Diabetes Neg Hx    Stroke Neg Hx     Social History   Socioeconomic History   Marital status: Married    Spouse name: Not on file   Number of children: 2   Years of education: Not on file   Highest education level: Not on file  Occupational History   Occupation: nurse    Comment: [employer]  Tobacco Use   Smoking status: Never   Smokeless tobacco: Never  Substance and Sexual Activity   Alcohol use: No    Alcohol/week: 0.0 standard drinks of alcohol   Drug use: No   Sexual activity: Yes    Partners: Male    Birth control/protection: Other-see comments  Comment: husband had vasectomy  Other Topics Concern   Not on file  Social History Narrative   Regular exercise: no   Caffeine Use: no   Lives with husband and 2 children (twin girls 2006)   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     omeprazole (PRILOSEC) 40 MG capsule Take 1 capsule (40  mg total) by mouth daily. 90 capsule 0   No facility-administered medications prior to visit.    Allergies  Allergen Reactions   Latex Swelling    Swelling of lips and face.    Review of Systems  Constitutional:  Negative for weight loss.  HENT:  Negative for congestion and hearing loss.   Eyes:  Negative for blurred vision.  Respiratory:  Negative for cough.   Cardiovascular:  Positive for leg swelling (reports occasional right leg swelling since she gained weight).  Gastrointestinal:  Negative for constipation and diarrhea.  Genitourinary:  Negative for dysuria and frequency.  Musculoskeletal:  Negative for joint pain and myalgias.  Skin:  Negative for rash.  Neurological:  Negative for headaches.  Psychiatric/Behavioral:         Denies depression/anxiety       Objective:    Physical Exam   BP 128/76 (BP Location: Right Arm, Patient Position: Sitting, Cuff Size: Large)   Pulse 61   Temp 98.6 F (37 C) (Oral)   Resp 16   Ht 5' 5.5" (1.664 m)   Wt 243 lb (110.2 kg)   SpO2 100%   BMI 39.82 kg/m  Wt Readings from Last 3 Encounters:  08/05/23 243 lb (110.2 kg)  08/01/22 247 lb (112 kg)  09/17/21 242 lb (109.8 kg)   Physical Exam  Constitutional: She is oriented to person, place, and time. She appears well-developed and well-nourished. No distress.  HENT:  Head: Normocephalic and atraumatic.  Right Ear: Tympanic membrane and ear canal normal.  Left Ear: Tympanic membrane and ear canal normal.  Mouth/Throat: Oropharynx is clear and moist.  Eyes: Pupils are equal, round, and reactive to light. No scleral icterus.  Neck: Normal range of motion. No thyromegaly present.  Cardiovascular: Normal rate and regular rhythm.   No murmur heard. Pulmonary/Chest: Effort normal and breath sounds normal. No respiratory distress. He has no wheezes. She has no rales. She exhibits no tenderness.  Abdominal: Soft. Bowel sounds are normal. She exhibits no distension and no mass.  There is no tenderness. There is no rebound and no guarding.  Musculoskeletal: She exhibits no edema.  Lymphadenopathy:    She has no cervical adenopathy.  Neurological: She is alert and oriented to person, place, and time. She has normal patellar reflexes. She exhibits normal muscle tone. Coordination normal.  Skin: Skin is warm and dry.  Psychiatric: She has a normal mood and affect. Her behavior is normal. Judgment and thought content normal.  Breast/pelvic: deferred           Assessment &  Plan:       Assessment & Plan:   Problem List Items Addressed This Visit       Unprioritized   Preventative health care - Primary    Routine visit. Tetanus up to date. Due for colonoscopy. Last Pap smear negative for HPV. Weight increased. No substance use. No current health concerns. Discussed lifestyle modifications for weight management. - Refer to gastroenterology for colonoscopy. - Order A1c and triglycerides lab work. - Advise on lifestyle modifications including reducing evening snacks and sugared drinks, and increasing physical activity such as walking.      Hyperglycemia   Relevant Orders   Comp Met (CMET)   HgB A1c   RESOLVED: Gastroesophageal reflux disease   Other Visit Diagnoses       Hypertriglyceridemia       Relevant Orders   Lipid panel       I am having Felina C. Griep maintain her multivitamin and omeprazole.  No orders of the defined types were placed in this encounter.

## 2023-08-05 NOTE — Assessment & Plan Note (Signed)
  Routine visit. Tetanus up to date. Due for colonoscopy. Last Pap smear negative for HPV. Weight increased. No substance use. No current health concerns. Discussed lifestyle modifications for weight management. - Refer to gastroenterology for colonoscopy. - Order A1c and triglycerides lab work. - Advise on lifestyle modifications including reducing evening snacks and sugared drinks, and increasing physical activity such as walking.

## 2023-08-11 ENCOUNTER — Other Ambulatory Visit: Payer: Self-pay | Admitting: Family

## 2023-08-11 DIAGNOSIS — R0789 Other chest pain: Secondary | ICD-10-CM

## 2023-10-19 ENCOUNTER — Encounter: Admitting: Internal Medicine

## 2023-11-17 ENCOUNTER — Other Ambulatory Visit: Payer: Self-pay | Admitting: Family

## 2023-11-17 DIAGNOSIS — R0789 Other chest pain: Secondary | ICD-10-CM

## 2023-12-09 ENCOUNTER — Other Ambulatory Visit (HOSPITAL_BASED_OUTPATIENT_CLINIC_OR_DEPARTMENT_OTHER): Payer: Self-pay | Admitting: Family

## 2023-12-09 ENCOUNTER — Encounter: Payer: Self-pay | Admitting: Internal Medicine

## 2023-12-09 DIAGNOSIS — Z1231 Encounter for screening mammogram for malignant neoplasm of breast: Secondary | ICD-10-CM

## 2023-12-29 ENCOUNTER — Ambulatory Visit (AMBULATORY_SURGERY_CENTER): Payer: Self-pay

## 2023-12-29 VITALS — Ht 65.5 in | Wt 245.0 lb

## 2023-12-29 DIAGNOSIS — Z1211 Encounter for screening for malignant neoplasm of colon: Secondary | ICD-10-CM

## 2023-12-29 MED ORDER — NA SULFATE-K SULFATE-MG SULF 17.5-3.13-1.6 GM/177ML PO SOLN: 1.0000 | Freq: Once | ORAL | 0 refills | Status: AC

## 2023-12-29 NOTE — Progress Notes (Signed)

## 2023-12-31 ENCOUNTER — Encounter: Payer: Self-pay | Admitting: Internal Medicine

## 2024-01-04 ENCOUNTER — Ambulatory Visit (HOSPITAL_BASED_OUTPATIENT_CLINIC_OR_DEPARTMENT_OTHER): Payer: Self-pay

## 2024-01-06 ENCOUNTER — Inpatient Hospital Stay (HOSPITAL_BASED_OUTPATIENT_CLINIC_OR_DEPARTMENT_OTHER): Admission: RE | Admit: 2024-01-06 | Payer: Self-pay | Source: Ambulatory Visit

## 2024-01-11 ENCOUNTER — Inpatient Hospital Stay (HOSPITAL_BASED_OUTPATIENT_CLINIC_OR_DEPARTMENT_OTHER): Admission: RE | Admit: 2024-01-11 | Payer: Self-pay | Source: Ambulatory Visit

## 2024-01-12 ENCOUNTER — Encounter (HOSPITAL_BASED_OUTPATIENT_CLINIC_OR_DEPARTMENT_OTHER): Payer: Self-pay

## 2024-01-12 ENCOUNTER — Ambulatory Visit (HOSPITAL_BASED_OUTPATIENT_CLINIC_OR_DEPARTMENT_OTHER)
Admission: RE | Admit: 2024-01-12 | Discharge: 2024-01-12 | Disposition: A | Discharge status: Home / Self Care | Source: Ambulatory Visit | Attending: Family | Admitting: Family

## 2024-01-12 ENCOUNTER — Ambulatory Visit (HOSPITAL_BASED_OUTPATIENT_CLINIC_OR_DEPARTMENT_OTHER): Payer: Self-pay

## 2024-01-12 DIAGNOSIS — Z1231 Encounter for screening mammogram for malignant neoplasm of breast: Secondary | ICD-10-CM | POA: Diagnosis present

## 2024-01-13 ENCOUNTER — Ambulatory Visit: Admitting: Internal Medicine

## 2024-01-13 ENCOUNTER — Encounter: Payer: Self-pay | Admitting: Internal Medicine

## 2024-01-13 VITALS — BP 139/72 | HR 68 | Temp 97.2°F | Resp 14 | Ht 65.5 in | Wt 245.0 lb

## 2024-01-13 DIAGNOSIS — Z1211 Encounter for screening for malignant neoplasm of colon: Secondary | ICD-10-CM

## 2024-01-13 DIAGNOSIS — K648 Other hemorrhoids: Secondary | ICD-10-CM | POA: Diagnosis not present

## 2024-01-13 DIAGNOSIS — D3610 Benign neoplasm of peripheral nerves and autonomic nervous system, unspecified: Secondary | ICD-10-CM

## 2024-01-13 DIAGNOSIS — D124 Benign neoplasm of descending colon: Secondary | ICD-10-CM

## 2024-01-13 DIAGNOSIS — D3615 Benign neoplasm of peripheral nerves and autonomic nervous system of abdomen: Secondary | ICD-10-CM | POA: Diagnosis not present

## 2024-01-13 MED ORDER — SODIUM CHLORIDE 0.9 % IV SOLN: 500.0000 mL | Freq: Once | INTRAVENOUS | Status: DC

## 2024-01-13 NOTE — Progress Notes (Signed)
 Pt's states no medical or surgical changes since previsit or office visit.

## 2024-01-13 NOTE — Progress Notes (Signed)
 Called to room to assist during endoscopic procedure.  Patient ID and intended procedure confirmed with present staff. Received instructions for my participation in the procedure from the performing physician.

## 2024-01-13 NOTE — Progress Notes (Signed)
 Report to PACU, RN, vss, BBS= Clear.

## 2024-01-13 NOTE — Op Note (Signed)
 [facility] Patient Name: Nicole Bernard Procedure Date: 01/13/2024 9:32 AM MRN: [id number] Endoscopist: Rosario Estefana Kidney , , [id number] Age: 43 Referring MD:  Date of Birth: April 14, 1981 Gender: Female Account #: [id number] Procedure:                Colonoscopy Indications:              Screening for colorectal malignant neoplasm, This                            is the patient's first colonoscopy Medicines:                Monitored Anesthesia Care Procedure:                Pre-Anesthesia Assessment:                           - Prior to the procedure, a History and Physical                            was performed, and patient medications and                            allergies were reviewed. The patient's tolerance of                            previous anesthesia was also reviewed. The risks                            and benefits of the procedure and the sedation                            options and risks were discussed with the patient.                            All questions were answered, and informed consent                            was obtained. Prior Anticoagulants: The patient has                            taken no anticoagulant or antiplatelet agents. ASA                            Grade Assessment: III - A patient with severe                            systemic disease. After reviewing the risks and                            benefits, the patient was deemed in satisfactory                            condition to undergo the procedure.  After obtaining informed consent, the colonoscope                            was passed under direct vision. Throughout the                            procedure, the patient's blood pressure, pulse, and                            oxygen saturations were monitored continuously. The                            Olympus Scope SN: [id number] was introduced through                            the anus and  advanced to the the cecum, identified                            by appendiceal orifice and ileocecal valve. The                            colonoscopy was performed without difficulty. The                            patient tolerated the procedure well. The quality                            of the bowel preparation was excellent. The                            ileocecal valve, appendiceal orifice, and rectum                            were photographed. Scope In: 9:34:35 AM Scope Out: 9:48:49 AM Scope Withdrawal Time: 0 hours 11 minutes 2 seconds  Total Procedure Duration: 0 hours 14 minutes 14 seconds  Findings:                 A 4 mm polyp was found in the descending colon. The                            polyp was sessile. The polyp was removed with a                            cold snare. Resection and retrieval were complete.                           Non-bleeding internal hemorrhoids were found during                            retroflexion. Complications:            No immediate complications. Estimated Blood Loss:     Estimated blood loss was minimal. Impression:               -  One 4 mm polyp in the descending colon, removed                            with a cold snare. Resected and retrieved.                           - Non-bleeding internal hemorrhoids. Recommendation:           - Discharge patient to home (with escort).                           - Await pathology results.                           - The findings and recommendations were discussed                            with the patient. Dr Estefana Federico Rosario Estefana Federico,  01/13/2024 9:55:04 AM

## 2024-01-13 NOTE — Patient Instructions (Signed)
-  Handout on polyp and hemorrhoids provided. -await pathology results. -repeat colonoscopy for surveillance recommended. Date to be determined when pathology result become available.  -Continue present medications.  YOU HAD AN ENDOSCOPIC PROCEDURE TODAY AT THE  ENDOSCOPY CENTER:   Refer to the procedure report that was given to you for any specific questions about what was found during the examination.  If the procedure report does not answer your questions, please call your gastroenterologist to clarify.  If you requested that your care partner not be given the details of your procedure findings, then the procedure report has been included in a sealed envelope for you to review at your convenience later.  YOU SHOULD EXPECT: Some feelings of bloating in the abdomen. Passage of more gas than usual.  Walking can help get rid of the air that was put into your GI tract during the procedure and reduce the bloating. If you had a lower endoscopy (such as a colonoscopy or flexible sigmoidoscopy) you may notice spotting of blood in your stool or on the toilet paper. If you underwent a bowel prep for your procedure, you may not have a normal bowel movement for a few days.  Please Note:  You might notice some irritation and congestion in your nose or some drainage.  This is from the oxygen used during your procedure.  There is no need for concern and it should clear up in a day or so.  SYMPTOMS TO REPORT IMMEDIATELY:  Following lower endoscopy (colonoscopy or flexible sigmoidoscopy):  Excessive amounts of blood in the stool  Significant tenderness or worsening of abdominal pains  Swelling of the abdomen that is new, acute  Fever of 100F or higher  For urgent or emergent issues, a gastroenterologist can be reached at any hour by calling [phone]. Do not use MyChart messaging for urgent concerns.    DIET:  We do recommend a small meal at first, but then you may proceed to your regular diet.   Drink plenty of fluids but you should avoid alcoholic beverages for 24 hours.  ACTIVITY:  You should plan to take it easy for the rest of today and you should NOT DRIVE or use heavy machinery until tomorrow (because of the sedation medicines used during the test).    FOLLOW UP: Our staff will call the number listed on your records the next business day following your procedure.  We will call around 7:15- 8:00 am to check on you and address any questions or concerns that you may have regarding the information given to you following your procedure. If we do not reach you, we will leave a message.     If any biopsies were taken you will be contacted by phone or by letter within the next 1-3 weeks.  Please call us  at [phone] if you have not heard about the biopsies in 3 weeks.    SIGNATURES/CONFIDENTIALITY: You and/or your care partner have signed paperwork which will be entered into your electronic medical record.  These signatures attest to the fact that that the information above on your After Visit Summary has been reviewed and is understood.  Full responsibility of the confidentiality of this discharge information lies with you and/or your care-partner.

## 2024-01-13 NOTE — Progress Notes (Signed)
 GASTROENTEROLOGY PROCEDURE H&P NOTE   Primary Care Physician: Daryl Setter, NP    Reason for Procedure:   Colon cancer screening  Plan:    Colonoscopy  Patient is appropriate for endoscopic procedure(s) in the ambulatory ([facility]) setting.  The nature of the procedure, as well as the risks, benefits, and alternatives were carefully and thoroughly reviewed with the patient. Ample time for discussion and questions allowed. The patient understood, was satisfied, and agreed to proceed.     HPI: Nicole Bernard is a 43 y.o. female who presents for colonoscopy for colon cancer screening. Denies blood in stools, changes in bowel habits, or unintentional weight loss. Denies family history of colon cancer.   Past Medical History:  Diagnosis Date   Abnormal Pap smear of vagina    Anemia    iron deficient   Cholelithiasis    Fatty liver    History of miscarriage    Onychomycosis of toenail    Overweight(278.02)    PAP SMEAR, ABNORMAL 11/09/2007   Qualifier: Diagnosis of   By: Amon MD, Aloysius BRAVO.       Past Surgical History:  Procedure Laterality Date   CESAREAN SECTION     GYNECOLOGIC CRYOSURGERY  2003   UMBILICAL HERNIA REPAIR  05/04/2017    Prior to Admission medications   Medication Sig Start Date End Date Taking? Authorizing Provider  Multiple Vitamin (MULTIVITAMIN) tablet Take 1 tablet by mouth daily.    [provider]  omeprazole  (PRILOSEC) 40 MG capsule Take 1 capsule (40 mg total) by mouth daily. 11/17/23   Daryl Setter, NP    Current Outpatient Medications  Medication Sig Dispense Refill   Multiple Vitamin (MULTIVITAMIN) tablet Take 1 tablet by mouth daily.     omeprazole  (PRILOSEC) 40 MG capsule Take 1 capsule (40 mg total) by mouth daily. 90 capsule 2   Current Facility-Administered Medications  Medication Dose Route Frequency Provider Last Rate Last Admin   0.9 %  sodium chloride  infusion  500 mL Intravenous Once Federico Rosario BROCKS, MD         Allergies as of 01/13/2024 - Review Complete 01/13/2024  Allergen Reaction Noted   Latex Swelling 04/22/2012    Family History  Problem Relation Age of Onset   COPD Paternal Grandmother    Cancer Paternal Grandfather        bone cancer   Heart disease Neg Hx    Hypertension Neg Hx    Diabetes Neg Hx    Stroke Neg Hx    Colon cancer Neg Hx    Rectal cancer Neg Hx    Stomach cancer Neg Hx     Social History   Socioeconomic History   Marital status: Married    Spouse name: Not on file   Number of children: 2   Years of education: Not on file   Highest education level: Not on file  Occupational History   Occupation: nurse    Comment: [employer]  Tobacco Use   Smoking status: Never   Smokeless tobacco: Never  Vaping Use   Vaping status: Never Used  Substance and Sexual Activity   Alcohol use: No    Alcohol/week: 0.0 standard drinks of alcohol   Drug use: No   Sexual activity: Yes    Partners: Male    Birth control/protection: Other-see comments    Comment: husband had vasectomy  Other Topics Concern   Not on file  Social History Narrative   Regular exercise: no   Caffeine Use:  no   Lives with husband and 2 children (twin girls 2006)   Works at [employer] ([employer]),  She is an LPN   Enjoys shopping                  Social Drivers of Corporate investment banker Strain: Not on [employer] Insecurity: Not on file  Transportation Needs: Not on file  Physical Activity: Not on file  Stress: Not on file  Social Connections: Not on file  Intimate Partner Violence: Not on file    Physical Exam: Vital signs in last 24 hours: BP 109/70   Pulse 71   Temp (!) 97.2 F (36.2 C) (Skin)   Resp 20   Ht 5' 5.5 (1.664 m)   Wt 245 lb (111.1 kg)   LMP 12/25/2023 (Exact Date)   SpO2 99%   BMI 40.15 kg/m  GEN: NAD EYE: Sclerae anicteric ENT: MMM CV: Non-tachycardic Pulm: No increased work of breathing GI: Soft, NT/ND NEURO:  Alert &  Oriented   Estefana Kidney, MD [facility]  01/13/2024 9:52 AM

## 2024-01-14 ENCOUNTER — Telehealth: Payer: Self-pay

## 2024-01-14 NOTE — Telephone Encounter (Signed)
 Attempted to reach patient for follow up phone call. No answer, left voicemail to contact Dr. Lafonda office with any questions or concerns.

## 2024-01-15 LAB — SURGICAL PATHOLOGY

## 2024-01-19 ENCOUNTER — Ambulatory Visit: Payer: Self-pay | Admitting: Internal Medicine

## 2024-08-05 ENCOUNTER — Encounter: Admitting: Family
